# Patient Record
Sex: Female | Born: 1960
Health system: Southern US, Community
[De-identification: ages and names within clinical notes are randomized; demographics above are authoritative.]

## PROBLEM LIST (undated history)

## (undated) DIAGNOSIS — R202 Paresthesia of skin: Secondary | ICD-10-CM

## (undated) DIAGNOSIS — E785 Hyperlipidemia, unspecified: Secondary | ICD-10-CM

## (undated) DIAGNOSIS — R112 Nausea with vomiting, unspecified: Secondary | ICD-10-CM

## (undated) DIAGNOSIS — Z5189 Encounter for other specified aftercare: Secondary | ICD-10-CM

## (undated) DIAGNOSIS — E119 Type 2 diabetes mellitus without complications: Secondary | ICD-10-CM

## (undated) DIAGNOSIS — Z87442 Personal history of urinary calculi: Secondary | ICD-10-CM

## (undated) DIAGNOSIS — N289 Disorder of kidney and ureter, unspecified: Secondary | ICD-10-CM

## (undated) DIAGNOSIS — T8859XA Other complications of anesthesia, initial encounter: Secondary | ICD-10-CM

## (undated) DIAGNOSIS — Z8679 Personal history of other diseases of the circulatory system: Secondary | ICD-10-CM

## (undated) DIAGNOSIS — B379 Candidiasis, unspecified: Secondary | ICD-10-CM

## (undated) DIAGNOSIS — N281 Cyst of kidney, acquired: Secondary | ICD-10-CM

## (undated) DIAGNOSIS — R51 Headache: Secondary | ICD-10-CM

## (undated) DIAGNOSIS — N201 Calculus of ureter: Secondary | ICD-10-CM

## (undated) DIAGNOSIS — Q2112 Patent foramen ovale: Secondary | ICD-10-CM

## (undated) DIAGNOSIS — M810 Age-related osteoporosis without current pathological fracture: Secondary | ICD-10-CM

## (undated) DIAGNOSIS — I10 Essential (primary) hypertension: Secondary | ICD-10-CM

## (undated) DIAGNOSIS — Q211 Atrial septal defect: Secondary | ICD-10-CM

## (undated) DIAGNOSIS — Z8051 Family history of malignant neoplasm of kidney: Secondary | ICD-10-CM

## (undated) DIAGNOSIS — Z9889 Other specified postprocedural states: Secondary | ICD-10-CM

## (undated) DIAGNOSIS — I6990 Unspecified sequelae of unspecified cerebrovascular disease: Secondary | ICD-10-CM

## (undated) DIAGNOSIS — Z800 Family history of malignant neoplasm of digestive organs: Secondary | ICD-10-CM

## (undated) DIAGNOSIS — T7840XA Allergy, unspecified, initial encounter: Secondary | ICD-10-CM

## (undated) DIAGNOSIS — I639 Cerebral infarction, unspecified: Secondary | ICD-10-CM

## (undated) HISTORY — PX: EXTRACORPOREAL SHOCK WAVE LITHOTRIPSY: SHX1557

## (undated) HISTORY — DX: Allergy, unspecified, initial encounter: T78.40XA

## (undated) HISTORY — PX: WISDOM TOOTH EXTRACTION: SHX21

## (undated) HISTORY — DX: Family history of malignant neoplasm of digestive organs: Z80.0

## (undated) HISTORY — DX: Encounter for other specified aftercare: Z51.89

## (undated) HISTORY — DX: Family history of malignant neoplasm of kidney: Z80.51

## (undated) HISTORY — DX: Cerebral infarction, unspecified: I63.9

---

## 1996-02-22 HISTORY — PX: NEPHROLITHOTOMY: SUR881

## 1998-01-07 ENCOUNTER — Other Ambulatory Visit: Admission: RE | Admit: 1998-01-07 | Discharge: 1998-01-07 | Payer: Self-pay | Admitting: Plastic Surgery

## 1999-10-28 ENCOUNTER — Other Ambulatory Visit: Admission: RE | Admit: 1999-10-28 | Discharge: 1999-10-28 | Payer: Self-pay | Admitting: Obstetrics and Gynecology

## 2001-02-21 HISTORY — PX: APPENDECTOMY: SHX54

## 2001-08-10 ENCOUNTER — Inpatient Hospital Stay (HOSPITAL_COMMUNITY): Admission: EM | Admit: 2001-08-10 | Discharge: 2001-08-12 | Payer: Self-pay | Admitting: Emergency Medicine

## 2001-08-10 ENCOUNTER — Encounter: Payer: Self-pay | Admitting: General Surgery

## 2001-08-10 ENCOUNTER — Encounter (INDEPENDENT_AMBULATORY_CARE_PROVIDER_SITE_OTHER): Payer: Self-pay | Admitting: *Deleted

## 2001-08-10 ENCOUNTER — Encounter: Payer: Self-pay | Admitting: Emergency Medicine

## 2001-08-10 HISTORY — PX: LAPAROSCOPIC APPENDECTOMY: SUR753

## 2002-02-21 HISTORY — PX: CARDIOVASCULAR STRESS TEST: SHX262

## 2002-07-03 ENCOUNTER — Encounter: Payer: Self-pay | Admitting: Internal Medicine

## 2002-07-03 HISTORY — PX: CARDIOVASCULAR STRESS TEST: SHX262

## 2003-08-08 ENCOUNTER — Encounter: Payer: Self-pay | Admitting: Internal Medicine

## 2004-04-14 ENCOUNTER — Ambulatory Visit: Payer: Self-pay | Admitting: Family Medicine

## 2004-10-22 ENCOUNTER — Ambulatory Visit: Payer: Self-pay | Admitting: Internal Medicine

## 2007-04-06 ENCOUNTER — Ambulatory Visit: Payer: Self-pay | Admitting: Internal Medicine

## 2007-04-06 DIAGNOSIS — E782 Mixed hyperlipidemia: Secondary | ICD-10-CM

## 2007-04-06 DIAGNOSIS — E7800 Pure hypercholesterolemia, unspecified: Secondary | ICD-10-CM | POA: Insufficient documentation

## 2007-04-06 LAB — CONVERTED CEMR LAB
Cholesterol, target level: 200 mg/dL
LDL Goal: 130 mg/dL

## 2007-07-25 ENCOUNTER — Telehealth (INDEPENDENT_AMBULATORY_CARE_PROVIDER_SITE_OTHER): Payer: Self-pay | Admitting: *Deleted

## 2007-08-16 ENCOUNTER — Ambulatory Visit: Payer: Self-pay | Admitting: Internal Medicine

## 2007-08-16 DIAGNOSIS — I10 Essential (primary) hypertension: Secondary | ICD-10-CM

## 2007-08-16 DIAGNOSIS — Z87442 Personal history of urinary calculi: Secondary | ICD-10-CM

## 2007-08-16 LAB — CONVERTED CEMR LAB
HDL goal, serum: 50 mg/dL
LDL Goal: 120 mg/dL

## 2007-08-25 LAB — CONVERTED CEMR LAB
AST: 16 units/L (ref 0–37)
Alkaline Phosphatase: 50 units/L (ref 39–117)
Basophils Absolute: 0 10*3/uL (ref 0.0–0.1)
Bilirubin, Direct: 0.1 mg/dL (ref 0.0–0.3)
CO2: 30 meq/L (ref 19–32)
Chloride: 101 meq/L (ref 96–112)
Direct LDL: 125 mg/dL
Eosinophils Absolute: 0.2 10*3/uL (ref 0.0–0.7)
GFR calc non Af Amer: 83 mL/min
HDL: 58.4 mg/dL (ref >39.0)
Lymphocytes Relative: 26 % (ref 12.0–46.0)
MCHC: 34.7 g/dL (ref 30.0–36.0)
MCV: 90.1 fL (ref 78.0–100.0)
Neutrophils Relative %: 63.2 % (ref 43.0–77.0)
Platelets: 274 10*3/uL (ref 150–400)
Potassium: 3.8 meq/L (ref 3.5–5.1)
Total Bilirubin: 1.1 mg/dL (ref 0.3–1.2)
Triglycerides: 108 mg/dL (ref 0–149)
VLDL: 22 mg/dL (ref 0–40)
WBC: 8.1 10*3/uL (ref 4.5–10.5)

## 2007-08-29 ENCOUNTER — Encounter (INDEPENDENT_AMBULATORY_CARE_PROVIDER_SITE_OTHER): Payer: Self-pay | Admitting: *Deleted

## 2008-02-22 DIAGNOSIS — Z8673 Personal history of transient ischemic attack (TIA), and cerebral infarction without residual deficits: Secondary | ICD-10-CM

## 2008-02-22 DIAGNOSIS — Q2112 Patent foramen ovale: Secondary | ICD-10-CM

## 2008-02-22 DIAGNOSIS — I639 Cerebral infarction, unspecified: Secondary | ICD-10-CM

## 2008-02-22 HISTORY — DX: Cerebral infarction, unspecified: I63.9

## 2008-02-22 HISTORY — DX: Patent foramen ovale: Q21.12

## 2008-02-22 HISTORY — DX: Personal history of transient ischemic attack (TIA), and cerebral infarction without residual deficits: Z86.73

## 2008-05-19 ENCOUNTER — Telehealth (INDEPENDENT_AMBULATORY_CARE_PROVIDER_SITE_OTHER): Payer: Self-pay | Admitting: *Deleted

## 2008-06-05 ENCOUNTER — Telehealth (INDEPENDENT_AMBULATORY_CARE_PROVIDER_SITE_OTHER): Payer: Self-pay | Admitting: *Deleted

## 2008-08-15 ENCOUNTER — Emergency Department (HOSPITAL_COMMUNITY): Admission: EM | Admit: 2008-08-15 | Discharge: 2008-08-16 | Payer: Self-pay | Admitting: Emergency Medicine

## 2008-08-20 ENCOUNTER — Ambulatory Visit: Payer: Self-pay | Admitting: Internal Medicine

## 2008-08-20 DIAGNOSIS — E876 Hypokalemia: Secondary | ICD-10-CM

## 2008-08-20 DIAGNOSIS — R209 Unspecified disturbances of skin sensation: Secondary | ICD-10-CM

## 2008-08-20 DIAGNOSIS — R7303 Prediabetes: Secondary | ICD-10-CM | POA: Insufficient documentation

## 2008-08-21 ENCOUNTER — Telehealth (INDEPENDENT_AMBULATORY_CARE_PROVIDER_SITE_OTHER): Payer: Self-pay | Admitting: *Deleted

## 2008-08-23 ENCOUNTER — Encounter: Admission: RE | Admit: 2008-08-23 | Discharge: 2008-08-23 | Payer: Self-pay | Admitting: Internal Medicine

## 2008-08-24 ENCOUNTER — Telehealth: Payer: Self-pay | Admitting: Endocrinology

## 2008-08-25 LAB — CONVERTED CEMR LAB
BUN: 14 mg/dL (ref 6–23)
Creatinine, Ser: 0.8 mg/dL (ref 0.4–1.2)
Potassium: 4.4 meq/L (ref 3.5–5.1)

## 2008-08-26 ENCOUNTER — Encounter (INDEPENDENT_AMBULATORY_CARE_PROVIDER_SITE_OTHER): Payer: Self-pay | Admitting: *Deleted

## 2008-08-29 ENCOUNTER — Ambulatory Visit: Payer: Self-pay | Admitting: Internal Medicine

## 2008-08-29 LAB — CONVERTED CEMR LAB: LDL Goal: 100 mg/dL

## 2008-10-15 ENCOUNTER — Encounter: Payer: Self-pay | Admitting: Internal Medicine

## 2008-10-21 ENCOUNTER — Encounter: Payer: Self-pay | Admitting: Cardiovascular Disease

## 2008-10-21 ENCOUNTER — Encounter (INDEPENDENT_AMBULATORY_CARE_PROVIDER_SITE_OTHER): Payer: Self-pay | Admitting: Neurology

## 2008-10-21 ENCOUNTER — Ambulatory Visit: Payer: Self-pay

## 2008-11-20 ENCOUNTER — Encounter: Payer: Self-pay | Admitting: Cardiology

## 2008-11-20 HISTORY — PX: OTHER SURGICAL HISTORY: SHX169

## 2008-12-02 ENCOUNTER — Ambulatory Visit: Payer: Self-pay | Admitting: Internal Medicine

## 2008-12-02 DIAGNOSIS — Z8673 Personal history of transient ischemic attack (TIA), and cerebral infarction without residual deficits: Secondary | ICD-10-CM

## 2008-12-02 DIAGNOSIS — R51 Headache: Secondary | ICD-10-CM

## 2008-12-02 DIAGNOSIS — R519 Headache, unspecified: Secondary | ICD-10-CM | POA: Insufficient documentation

## 2008-12-04 ENCOUNTER — Encounter (INDEPENDENT_AMBULATORY_CARE_PROVIDER_SITE_OTHER): Payer: Self-pay | Admitting: *Deleted

## 2008-12-04 LAB — CONVERTED CEMR LAB
AST: 17 units/L (ref 0–37)
Albumin: 4.5 g/dL (ref 3.5–5.2)
Alkaline Phosphatase: 45 units/L (ref 39–117)
Basophils Relative: 0.5 % (ref 0.0–3.0)
CO2: 28 meq/L (ref 19–32)
Calcium: 9.2 mg/dL (ref 8.4–10.5)
GFR calc non Af Amer: 82.04 mL/min (ref >60)
HDL: 59.7 mg/dL (ref >39.00)
Hemoglobin: 14.3 g/dL (ref 12.0–15.0)
Lymphocytes Relative: 35 % (ref 12.0–46.0)
MCHC: 34.7 g/dL (ref 30.0–36.0)
Monocytes Relative: 6.9 % (ref 3.0–12.0)
Neutro Abs: 2.9 10*3/uL (ref 1.4–7.7)
Potassium: 4.8 meq/L (ref 3.5–5.1)
RBC: 4.56 M/uL (ref 3.87–5.11)
Sodium: 138 meq/L (ref 135–145)
Total CHOL/HDL Ratio: 3
Total Protein: 6.9 g/dL (ref 6.0–8.3)

## 2009-01-05 ENCOUNTER — Telehealth: Payer: Self-pay | Admitting: Cardiology

## 2009-01-05 ENCOUNTER — Encounter: Payer: Self-pay | Admitting: Cardiology

## 2009-01-06 ENCOUNTER — Encounter: Payer: Self-pay | Admitting: Cardiology

## 2009-01-19 ENCOUNTER — Ambulatory Visit (HOSPITAL_COMMUNITY): Admission: RE | Admit: 2009-01-19 | Discharge: 2009-01-19 | Payer: Self-pay | Admitting: Cardiovascular Disease

## 2009-01-19 ENCOUNTER — Ambulatory Visit: Payer: Self-pay | Admitting: Cardiovascular Disease

## 2009-01-19 ENCOUNTER — Encounter: Payer: Self-pay | Admitting: Cardiovascular Disease

## 2009-01-19 HISTORY — PX: TRANSESOPHAGEAL ECHOCARDIOGRAM: SHX273

## 2009-02-25 ENCOUNTER — Encounter: Payer: Self-pay | Admitting: Internal Medicine

## 2009-03-19 ENCOUNTER — Telehealth (INDEPENDENT_AMBULATORY_CARE_PROVIDER_SITE_OTHER): Payer: Self-pay | Admitting: *Deleted

## 2009-06-23 ENCOUNTER — Encounter: Payer: Self-pay | Admitting: Internal Medicine

## 2009-09-01 ENCOUNTER — Encounter: Payer: Self-pay | Admitting: Internal Medicine

## 2009-12-31 ENCOUNTER — Telehealth (INDEPENDENT_AMBULATORY_CARE_PROVIDER_SITE_OTHER): Payer: Self-pay | Admitting: *Deleted

## 2010-03-18 ENCOUNTER — Ambulatory Visit
Admission: RE | Admit: 2010-03-18 | Discharge: 2010-03-18 | Payer: Self-pay | Source: Home / Self Care | Attending: Internal Medicine | Admitting: Internal Medicine

## 2010-03-18 ENCOUNTER — Other Ambulatory Visit: Payer: Self-pay | Admitting: Internal Medicine

## 2010-03-18 ENCOUNTER — Encounter: Payer: Self-pay | Admitting: Internal Medicine

## 2010-03-18 LAB — HEPATIC FUNCTION PANEL
Albumin: 4.1 g/dL (ref 3.5–5.2)
Alkaline Phosphatase: 45 U/L (ref 39–117)
Total Protein: 6.6 g/dL (ref 6.0–8.3)

## 2010-03-18 LAB — CBC WITH DIFFERENTIAL/PLATELET
Basophils Absolute: 0 10*3/uL (ref 0.0–0.1)
Basophils Relative: 0.2 % (ref 0.0–3.0)
Eosinophils Absolute: 0.1 10*3/uL (ref 0.0–0.7)
HCT: 40.8 % (ref 36.0–46.0)
Hemoglobin: 13.7 g/dL (ref 12.0–15.0)
Lymphocytes Relative: 24.5 % (ref 12.0–46.0)
Lymphs Abs: 2 10*3/uL (ref 0.7–4.0)
MCHC: 33.6 g/dL (ref 30.0–36.0)
MCV: 92.4 fl (ref 78.0–100.0)
Neutro Abs: 5.5 10*3/uL (ref 1.4–7.7)
RBC: 4.41 Mil/uL (ref 3.87–5.11)
RDW: 12.3 % (ref 11.5–14.6)

## 2010-03-18 LAB — TSH: TSH: 1.01 u[IU]/mL (ref 0.35–5.50)

## 2010-03-18 LAB — BASIC METABOLIC PANEL
CO2: 28 mEq/L (ref 19–32)
Chloride: 100 mEq/L (ref 96–112)
Glucose, Bld: 71 mg/dL (ref 70–99)
Potassium: 4.7 mEq/L (ref 3.5–5.1)
Sodium: 137 mEq/L (ref 135–145)

## 2010-03-18 LAB — LIPID PANEL
Cholesterol: 167 mg/dL (ref 0–200)
HDL: 52.4 mg/dL (ref >39.00)
LDL Cholesterol: 105 mg/dL — ABNORMAL HIGH (ref 0–99)
Total CHOL/HDL Ratio: 3
Triglycerides: 49 mg/dL (ref 0.0–149.0)
VLDL: 9.8 mg/dL (ref 0.0–40.0)

## 2010-03-23 NOTE — Progress Notes (Signed)
Summary: Diltiazem refill  Phone Note Refill Request Message from:  Patient on December 31, 2009 11:36 AM  Refills Requested: Medication #1:  DILTIAZEM HCL 90 MG  TABS 1 two times a day [facility] , [address]     patient has scheduled CPX and fasting labs for 03/18/2010 at 9:40am  Initial call taken by: Jerolyn Shin,  December 31, 2009 11:37 AM    Prescriptions: DILTIAZEM HCL 90 MG  TABS (DILTIAZEM HCL) 1 two times a day  #180 Each x 0   Entered by:   Shonna Chock CMA   Authorized by:   Marga Melnick MD   Signed by:   Shonna Chock CMA on 12/31/2009   Method used:   Electronically to        [facility]  [phone]* (retail)       [address]       [place], [place]  [address]       Ph: [phone] or [phone]       Fax: [phone]   RxID:   [id number]

## 2010-03-23 NOTE — Progress Notes (Signed)
Summary: lab results  Phone Note Call from Patient Call back at Home Phone [phone]   Caller: Patient-left voice msg Summary of Call: patient left msg says she never received her labs from 12/02/2008  Your minimal LDL goal = < 120,ideal is < 95. HDL > 40 is protective as is smoking cessation. Great reports! share with all MDs seen. Hopp  Follow-up for Phone Call        left message on machine ................Marland KitchenDoristine Devoid  March 19, 2009 4:00 PM   Additional Follow-up for Phone Call Additional follow up Details #1::        Spoke with patient, patient aware of lab results and I verified address and remailed Additional Follow-up by: Shonna Chock,  March 20, 2009 3:43 PM

## 2010-03-23 NOTE — Letter (Signed)
Summary: [facility]  [facility]   Imported By: Lanelle Bal 03/05/2009 09:51:32  _____________________________________________________________________  External Attachment:    Type:   Image     Comment:   External Document

## 2010-03-25 NOTE — Assessment & Plan Note (Signed)
Summary: cpx and fasting labs///sph   Vital Signs:  Patient profile:   50 year old female Height:      59 inches Weight:      138.8 pounds BMI:     28.14 Temp:     98.3 degrees F oral Pulse rate:   76 / minute Resp:     14 per minute BP sitting:   110 / 76  (left arm) Cuff size:   regular  Vitals Entered By: Shonna Chock CMA (March 18, 2010 9:56 AM) CC: CPX with fasting labs    CC:  CPX with fasting labs .  History of Present Illness:    Brenda Gomez is here for a  physical; she has rare numbness & tingling radiating from hands to lateral thorax down to waist bilaterally lasting < 3 minutes. Dr. Anne Hahn said he could not explain this phenomenom.    Hyperlipidemia Follow-Up:The patient denies muscle aches, GI upset, abdominal pain, flushing, itching, constipation, diarrhea, and fatigue.  The patient denies the following symptoms: chest pain/pressure, exercise intolerance, dypsnea, palpitations, syncope, and pedal edema.  Compliance with medications (by patient report) has been near 100%.  Dietary compliance has been good.  Adjunctive measures currently used by the patient include fiber, ASA, and fish oil supplements.     Hypertension Follow-Up:The patient reports headaches responsive to Nortrityline Rxed by Dr Anne Hahn, but denies lightheadedness and urinary frequency.  Compliance with medications (by patient report) has been near 100%.  Adjunctive measures currently used by the patient include salt restriction.    Preventive Screening-Counseling & Management  Alcohol-Tobacco     Smoking Cessation Counseling: yes  Caffeine-Diet-Exercise     Does Patient Exercise: no  Current Medications (verified): 1)  Zyrtec 10mg  Prn 2)  Diltiazem Hcl 90 Mg  Tabs (Diltiazem Hcl) .Marland Kitchen.. 1 Two Times A Day 3)  Lisinopril 20 Mg  Tabs (Lisinopril) .Marland Kitchen.. 1 Two Times A Day 4)  Pravastatin Sodium 20 Mg Tabs (Pravastatin Sodium) .Marland Kitchen.. 1 At Bedtime **appointment Due** 5)  Denavir 1 % Crea (Penciclovir) ....  As Directed 6)  Nortriptyline Hcl 10 Mg Caps (Nortriptyline Hcl) .... 2-3 At Bedtime 7)  Aspirin 81 Mg Tabs (Aspirin) .Marland Kitchen.. 1 By Mouth Once Daily  Allergies: 1)  ! Codeine  Past History:  Past Medical History: Hypertension Hyperlipidemia: LDL 137(1525 total particles / 715 small dense), HDL 47, TG 139 for 15% risk; minimal LDL goal = < 120, ideal < 95  based on NMR Lipoprofile Nephrolithiasis, PMH  of, Dr Aldean Ast Cerebrovascular accident, PMH  of, R pontine & L thalamic, Dr Anne Hahn  Past Surgical History: Appendectomy; G2 P2; Wisdom Teeth Extraction; Kidney Stones w/ left kidney damage secondary to infection; renal cyst, Dr Vic Blackbird  Family History: Father: HTN Mother: DM,CVA   @ 35 Siblings: bro: HTN,sister:bone cancer  ,S/P BKA unilaterally ZOX:WRUEAVWU MGM:DM,HTN,TIA,MI  @ age 62 MGGM:DM  Social History: No diet Occupation: Systems developer Married Smoking 1 cigarette; exposed to 2nd hand smoke Alcohol use-yes: rarely Regular exercise-no Does Patient Exercise:  no  Review of Systems  The patient denies anorexia, fever, weight loss, weight gain, vision loss, decreased hearing, hoarseness, prolonged cough, hemoptysis, melena, hematochezia, severe indigestion/heartburn, hematuria, suspicious skin lesions, depression, unusual weight change, abnormal bleeding, enlarged lymph nodes, and angioedema.    Physical Exam  General:  well-nourished,in no acute distress; alert,appropriate and cooperative throughout examination Head:  Normocephalic and atraumatic without obvious abnormalities. Eyes:  No corneal or conjunctival inflammation noted. EOMI. Perrla. Funduscopic exam benign, without  hemorrhages, exudates or papilledema.  Ears:  External ear exam shows no significant lesions or deformities.  Otoscopic examination reveals clear canals, tympanic membranes are intact bilaterally without bulging, retraction, inflammation or discharge. Hearing is grossly normal  bilaterally. Nose:  External nasal examination shows no deformity or inflammation. Nasal mucosa are pink and moist without lesions or exudates. Slight  septal deviation Mouth:  Oral mucosa and oropharynx without lesions or exudates.  Upper plate Neck:  No deformities, masses, or tenderness noted. Lungs:  Normal respiratory effort, chest expands symmetrically. Lungs are clear to auscultation, no crackles or wheezes. Heart:  Normal rate and regular rhythm. S1 and S2 normal without gallop, murmur, click, rub . Abdomen:  Bowel sounds positive,abdomen soft and non-tender without masses, organomegaly or hernias noted. Genitalia:  Dr Arelia Sneddon Msk:  No deformity or scoliosis noted of thoracic or lumbar spine.   Pulses:  R and L carotid,radial,dorsalis pedis and posterior tibial pulses are full and equal bilaterally Extremities:  No clubbing, cyanosis, edema, or deformity noted with normal full range of motion of all joints.   Neurologic:  alert & oriented X3, strength normal in all extremities, and DTRs symmetrical and normal.  Neg Tinel's bilaterally Skin:  Intact without suspicious lesions or rashes Cervical Nodes:  No lymphadenopathy noted Axillary Nodes:  No palpable lymphadenopathy Psych:  memory intact for recent and remote, normally interactive, and good eye contact.     Impression & Recommendations:  Problem # 1:  ROUTINE GENERAL MEDICAL EXAM@HEALTH  CARE FACL (ICD-V70.0)  Orders: EKG w/ Interpretation (93000) Venipuncture (21308) TLB-Lipid Panel (80061-LIPID) TLB-BMP (Basic Metabolic Panel-BMET) (80048-METABOL) TLB-CBC Platelet - w/Differential (85025-CBCD) TLB-Hepatic/Liver Function Pnl (80076-HEPATIC) TLB-TSH (Thyroid Stimulating Hormone) (84443-TSH) TLB-Magnesium (Mg) (83735-MG)  Problem # 2:  PARESTHESIA (ICD-782.0) Atypical  Problem # 3:  CEREBROVASCULAR ACCIDENT, HX OF (ICD-V12.50)  Problem # 4:  HYPERLIPIDEMIA (ICD-272.2)  Her updated medication list for this problem  includes:    Pravastatin Sodium 20 Mg Tabs (Pravastatin sodium) .Marland Kitchen... 1 at bedtime  Problem # 5:  HYPERTENSION (ICD-401.9) controlled Her updated medication list for this problem includes:    Diltiazem Hcl 90 Mg Tabs (Diltiazem hcl) .Marland Kitchen... 1 two times a day    Lisinopril 20 Mg Tabs (Lisinopril) .Marland Kitchen... 1 two times a day  Complete Medication List: 1)  Zyrtec 10mg  Prn  2)  Diltiazem Hcl 90 Mg Tabs (Diltiazem hcl) .Marland Kitchen.. 1 two times a day 3)  Lisinopril 20 Mg Tabs (Lisinopril) .Marland Kitchen.. 1 two times a day 4)  Pravastatin Sodium 20 Mg Tabs (Pravastatin sodium) .Marland Kitchen.. 1 at bedtime 5)  Denavir 1 % Crea (Penciclovir) .... As directed 6)  Nortriptyline Hcl 10 Mg Caps (Nortriptyline hcl) .... 2-3 at bedtime 7)  Aspirin 81 Mg Tabs (Aspirin) .Marland Kitchen.. 1 by mouth once daily  Other Orders: Tdap => 45yrs IM (65784) Admin 1st Vaccine (69629)  Patient Instructions: 1)  Smoking doubles or triples risk of MI or CVA as discussed.Marland Kitchen 2)  Check your Blood Pressure regularly. If it is above:  [address] you should make an appointment. Prescriptions: PRAVASTATIN SODIUM 20 MG TABS (PRAVASTATIN SODIUM) 1 at bedtime  #90 x 3   Entered and Authorized by:   Marga Melnick MD   Signed by:   Marga Melnick MD on 03/18/2010   Method used:   Print then Give to Patient   RxID:   [id number] LISINOPRIL 20 MG  TABS (LISINOPRIL) 1 two times a day  #180 x 3   Entered and Authorized by:   Chrissie Noa  Hopper MD   Signed by:   Marga Melnick MD on 03/18/2010   Method used:   Print then Give to Patient   RxID:   [id number] DILTIAZEM HCL 90 MG  TABS (DILTIAZEM HCL) 1 two times a day  #180 Each x 3   Entered and Authorized by:   Marga Melnick MD   Signed by:   Marga Melnick MD on 03/18/2010   Method used:   Print then Give to Patient   RxID:   [id number]    Orders Added: 1)  Tdap => 59yrs IM [90715] 2)  Admin 1st Vaccine [90471] 3)  Est. Patient 40-64 years [99396] 4)  EKG w/ Interpretation [93000] 5)   Venipuncture [36415] 6)  TLB-Lipid Panel [80061-LIPID] 7)  TLB-BMP (Basic Metabolic Panel-BMET) [80048-METABOL] 8)  TLB-CBC Platelet - w/Differential [85025-CBCD] 9)  TLB-Hepatic/Liver Function Pnl [80076-HEPATIC] 10)  TLB-TSH (Thyroid Stimulating Hormone) [84443-TSH] 11)  TLB-Magnesium (Mg) [83735-MG]   Immunizations Administered:  Tetanus Vaccine:    Vaccine Type: Tdap    Site: right deltoid    Mfr: GlaxoSmithKline    Dose: 0.5 ml    Route: IM    Given by: Shonna Chock CMA    Exp. Date: 12/11/2011    Lot #: [id number]    VIS given: 01/09/08 version given March 18, 2010.   Immunizations Administered:  Tetanus Vaccine:    Vaccine Type: Tdap    Site: right deltoid    Mfr: GlaxoSmithKline    Dose: 0.5 ml    Route: IM    Given by: Shonna Chock CMA    Exp. Date: 12/11/2011    Lot #: [id number]    VIS given: 01/09/08 version given March 18, 2010.   Appended Document: cpx and fasting labs///sph

## 2010-03-26 NOTE — Letter (Signed)
Summary: [facility]  [facility]   Imported By: Lanelle Bal 09/09/2009 10:52:01  _____________________________________________________________________  External Attachment:    Type:   Image     Comment:   External Document

## 2010-05-31 LAB — URINALYSIS, ROUTINE W REFLEX MICROSCOPIC
Bilirubin Urine: NEGATIVE
Glucose, UA: NEGATIVE mg/dL
Ketones, ur: NEGATIVE mg/dL
Nitrite: NEGATIVE
pH: 7.5 (ref 5.0–8.0)

## 2010-05-31 LAB — BASIC METABOLIC PANEL
Calcium: 8.7 mg/dL (ref 8.4–10.5)
GFR calc Af Amer: >60 mL/min (ref >60)
GFR calc non Af Amer: >60 mL/min (ref >60)
Sodium: 134 mEq/L — ABNORMAL LOW (ref 135–145)

## 2010-05-31 LAB — DIFFERENTIAL
Lymphocytes Relative: 14 % (ref 12–46)
Monocytes Absolute: 0.6 10*3/uL (ref 0.1–1.0)
Monocytes Relative: 5 % (ref 3–12)
Neutro Abs: 9.8 10*3/uL — ABNORMAL HIGH (ref 1.7–7.7)

## 2010-05-31 LAB — CBC
Hemoglobin: 14.6 g/dL (ref 12.0–15.0)
RBC: 4.75 MIL/uL (ref 3.87–5.11)
WBC: 12.3 10*3/uL — ABNORMAL HIGH (ref 4.0–10.5)

## 2010-05-31 LAB — URINE CULTURE: Colony Count: 15000

## 2010-05-31 LAB — POCT PREGNANCY, URINE: Preg Test, Ur: NEGATIVE

## 2010-07-09 NOTE — Op Note (Signed)
[place]  Patient:    Brenda Gomez, Brenda Gomez Visit Number: [id number] MRN: [id number]          Service Type: SUR Location: [id number] Attending Physician:  Arlis Porta Dictated by:   Adolph Pollack, M.D. Proc. Date: 08/10/01 Admit Date:  08/10/2001 Discharge Date: 08/12/2001                             Operative Report  PREOPERATIVE DIAGNOSIS:  Acute appendicitis.  POSTOPERATIVE DIAGNOSIS:  Acute appendicitis.  PROCEDURE:  Laparoscopic appendectomy.  SURGEON:  Adolph Pollack, M.D.  ANESTHESIA:  General.  INDICATION:  This is a 50 year old female who presented to the emergency department with abdominal pain.  It was mostly in the right lower quadrant. Evaluation which included a CT scan was consistent with appendicitis, and I was asked to see her.  She is now brought to the operating room.  DESCRIPTION OF PROCEDURE:  She was placed supine on the operating table and a general anesthetic was administered.  A Foley catheter was placed in the bladder.  Her abdomen was sterilely prepped and draped.  Local anesthetic was infiltrated in the umbilicus, and a small subumbilical incision was made, incising the skin and subcutaneous tissue sharply.  The midline fascia was identified and incised.  A pursestring suture of 0 Vicryl was placed around the fascial edges.  The peritoneum was grasped and entered bluntly.  A Hasson trocar was introduced to the peritoneal cavity and pneumoperitoneum created by insufflation of CO2 gas.  Next a laparoscope was introduced.  Under direct vision in the lower midline a 5 mm trocar was placed.  She was then placed in Trendelenburg position and partial right lateral decubitus.  I identified the appendix, and it was acutely inflamed.  It was adherent to part of the ileum but with gentle blunt dissection, this was freed up.  I then placed a 5 mm trocar in the right upper quadrant, grasped the  mesoappendix, and retracted the appendix anteriorly toward the abdominal wall.  Using the Harmonic scalpel, the mesoappendix was divided down to the base of the appendix.  The appendix was then amputated with the Endo-GIA stapler.  The staple line was solid without bleeding.  The appendix was placed in an Endopouch bag, and this was removed through the subumbilical port.  I then copiously irrigated out the right lower quadrant area with 1 L of normal saline and evacuated the majority of this.  I reinspected the staple line, and again it was solid without bleeding.  I subsequently removed the Hasson trocar and closed the fascial defect under direct laparoscopic vision by tightening up and tying down the pursestring suture.  The remaining trocars were removed and the pneumoperitoneum was released.  The skin incisions were closed with 4-0 Monocryl subcuticular stitches, followed by Steri-Strips and sterile dressings.  She tolerated the procedure well without any apparent complications and was taken to the recovery room in satisfactory condition. Dictated by:   Adolph Pollack, M.D. Attending Physician:  Arlis Porta DD:  08/10/01 TD:  08/13/01 Job: [id number]

## 2010-07-09 NOTE — H&P (Signed)
[place]. [facility]  Patient:    Brenda Gomez, Brenda Gomez Visit Number: [id number] MRN: [id number]          Service Type: SUR Location: 5000 [id number] Attending Physician:  Arlis Porta Dictated by:   Adolph Pollack, M.D. Admit Date:  08/10/2001 Discharge Date: 08/12/2001                           History and Physical  REASON FOR ADMISSION:  Right lower quadrant pain, appendicitis on CT scan.  HISTORY OF PRESENT ILLNESS:  Ms. Schroepfer is a 50 year old female in her normal state of health until she awoke at 4 oclock in the morning with cramping and throbbing type lower abdominal pain.  The pain eventually radiated to right lower quadrant with increasing severity and was like the throbbing type pain. It was associated with a subjective fever and anorexia.  She eventually presented to the emergency department because it became so bad.  Evaluation in the emergency department included a CT scan which demonstrated a fecalith and findings consistent with acute appendicitis.  I was asked to see her at that time.  She denies any change in bowel habits.  She does not have dysuria.  PAST MEDICAL HISTORY: 1. Hypertension. 2. Nephrolithiasis. 3. Allergic rhinitis.  PREVIOUS OPERATIONS:  Left nephrolithectomy.  ALLERGIES:  CODEINE.  MEDICATIONS: 1. Norvasc. 2. Zyrtec.  SOCIAL HISTORY:  She smokes approximately a pack of cigarettes every three days.  She is married, has two children, and is employed.  FAMILY HISTORY:  Positive for diabetes and hypertension in mother.  Sister died from cancer but she does not know the type.  REVIEW OF SYSTEMS:  CARDIOVASCULAR:  No known heart disease in herself. PULMONARY:  She denies asthma, pneumonia, TB.  GASTROINTESTINAL:  Denies peptic ulcer disease, hepatitis, diverticulitis.  ENDOCRINE:  No diabetes or thyroid disease.  HEMATOLOGIC:  She states she had a transfusion in the past. No known bleeding disorders or  deep venous thrombosis.  NEUROLOGIC:  No seizures.  PHYSICAL EXAMINATION:  GENERAL:  An ill-appearing female.  VITAL SIGNS:  At 10:30 this morning:  Temperature 97.4, pulse 98, blood pressure 130/80.  SKIN:  Appears to have increased warmth.  HEENT:  Normocephalic, atraumatic, EOMI.  NECK:  Supple without palpable masses.  CARDIOVASCULAR:  Heart demonstrates an increased rate with irregularity.  RESPIRATORY:  Breath sounds equal and clear.  Respirations nonlabored.  ABDOMEN:  Soft with right lower quadrant tenderness and guarding with palpation and percussion.  She has a positive Rovsings sign.  Active bowel sounds noted.  BACK:  No CVA tenderness.  EXTREMITIES:  No cyanosis or edema.  Full range of motion.  LABORATORY DATA:  White blood cell count 19,000 with a hemoglobin of 14. Platelet count 256,000.  Amylase and lipase normal.  Liver function tests normal.  Potassium slightly low at 3.4.  UA shows 3-6 white blood cells. Urine protein negative.  IMPRESSION:  Acute appendicitis.  PLAN:  IV antibiotics and laparoscopic appendectomy.  I did explain the procedure and risks to her.  The risks include, but are not limited to, bleeding, infection, accidental damage to bladder, intestine, or other intra-abdominal organs, and risk of anesthesia.  She seemed to understand this and agrees to proceed. Dictated by:   Adolph Pollack, M.D. Attending Physician:  Arlis Porta DD:  08/10/01 TD:  08/12/01 Job: [id number]

## 2010-07-22 ENCOUNTER — Encounter: Payer: Self-pay | Admitting: Internal Medicine

## 2011-03-01 ENCOUNTER — Telehealth: Payer: Self-pay

## 2011-03-01 NOTE — Telephone Encounter (Signed)
FYI:  Pt left a message on triage vm stating that she has a reoccurring pain in the front lower right side for a couple of months.  Pt states she thought it was a female problem but the pain is growing in intensity.    Called pt and she states the pain is growing in intensity and pt had her appendix out a few years ago.  Pt has been taking ibuprofen for pain. Pt denies vomiting, fever, nausea, or diarrhea.  Pt states she is having to bend to the left for relief.    Pt advised to call gyn doctor to see if she can get appt sooner.  Pt will call back.

## 2011-03-01 NOTE — Telephone Encounter (Signed)
Pt called back and states she has an appt. With her gyn on tomorrow.  After appt pt will call office back.

## 2011-03-03 ENCOUNTER — Ambulatory Visit: Payer: Self-pay | Admitting: Internal Medicine

## 2011-03-18 ENCOUNTER — Other Ambulatory Visit: Payer: Self-pay | Admitting: Internal Medicine

## 2011-03-23 ENCOUNTER — Encounter (HOSPITAL_BASED_OUTPATIENT_CLINIC_OR_DEPARTMENT_OTHER): Payer: Self-pay | Admitting: *Deleted

## 2011-03-23 ENCOUNTER — Other Ambulatory Visit: Payer: Self-pay | Admitting: Urology

## 2011-03-23 NOTE — Progress Notes (Addendum)
NPO AFTER MN. ARRIVES AT 1130. NEEDS ISTAT, EKG, AND URINE PREG.  WILL TAKE DILTIAZEM AM OF SURG. W/ SIP OF WATER AND IF NEEDED TAKE HYDROCODONE.  REVIEWED CHART W/ DR ROSE MDA, STATES OK TO PROCEED AND NOTATE IN CHART NO AIR BUBBLES IN IV LINE.

## 2011-03-24 ENCOUNTER — Encounter (HOSPITAL_BASED_OUTPATIENT_CLINIC_OR_DEPARTMENT_OTHER): Payer: Self-pay | Admitting: *Deleted

## 2011-03-24 ENCOUNTER — Other Ambulatory Visit: Payer: Self-pay | Admitting: Urology

## 2011-03-24 ENCOUNTER — Encounter (HOSPITAL_BASED_OUTPATIENT_CLINIC_OR_DEPARTMENT_OTHER): Payer: Self-pay | Admitting: Anesthesiology

## 2011-03-24 ENCOUNTER — Encounter (HOSPITAL_BASED_OUTPATIENT_CLINIC_OR_DEPARTMENT_OTHER)
Admission: RE | Disposition: A | Discharge status: Home / Self Care | Payer: Self-pay | Source: Ambulatory Visit | Attending: Urology

## 2011-03-24 ENCOUNTER — Other Ambulatory Visit: Payer: Self-pay

## 2011-03-24 ENCOUNTER — Ambulatory Visit (HOSPITAL_BASED_OUTPATIENT_CLINIC_OR_DEPARTMENT_OTHER): Payer: 59 | Admitting: Anesthesiology

## 2011-03-24 ENCOUNTER — Ambulatory Visit (HOSPITAL_BASED_OUTPATIENT_CLINIC_OR_DEPARTMENT_OTHER)
Admission: RE | Admit: 2011-03-24 | Discharge: 2011-03-24 | Disposition: A | Discharge status: Home / Self Care | Payer: 59 | Source: Ambulatory Visit | Attending: Urology | Admitting: Urology

## 2011-03-24 DIAGNOSIS — N269 Renal sclerosis, unspecified: Secondary | ICD-10-CM | POA: Insufficient documentation

## 2011-03-24 DIAGNOSIS — D414 Neoplasm of uncertain behavior of bladder: Secondary | ICD-10-CM | POA: Insufficient documentation

## 2011-03-24 DIAGNOSIS — N189 Chronic kidney disease, unspecified: Secondary | ICD-10-CM | POA: Insufficient documentation

## 2011-03-24 DIAGNOSIS — Z79899 Other long term (current) drug therapy: Secondary | ICD-10-CM | POA: Insufficient documentation

## 2011-03-24 DIAGNOSIS — N201 Calculus of ureter: Secondary | ICD-10-CM | POA: Insufficient documentation

## 2011-03-24 DIAGNOSIS — I129 Hypertensive chronic kidney disease with stage 1 through stage 4 chronic kidney disease, or unspecified chronic kidney disease: Secondary | ICD-10-CM | POA: Insufficient documentation

## 2011-03-24 DIAGNOSIS — R3129 Other microscopic hematuria: Secondary | ICD-10-CM | POA: Insufficient documentation

## 2011-03-24 DIAGNOSIS — N200 Calculus of kidney: Secondary | ICD-10-CM | POA: Insufficient documentation

## 2011-03-24 DIAGNOSIS — Z7982 Long term (current) use of aspirin: Secondary | ICD-10-CM | POA: Insufficient documentation

## 2011-03-24 HISTORY — DX: Headache: R51

## 2011-03-24 HISTORY — DX: Unspecified sequelae of unspecified cerebrovascular disease: I69.90

## 2011-03-24 HISTORY — DX: Hyperlipidemia, unspecified: E78.5

## 2011-03-24 HISTORY — DX: Personal history of urinary calculi: Z87.442

## 2011-03-24 HISTORY — DX: Paresthesia of skin: R20.2

## 2011-03-24 HISTORY — PX: CYSTOSCOPY W/ RETROGRADES: SHX1426

## 2011-03-24 HISTORY — DX: Cyst of kidney, acquired: N28.1

## 2011-03-24 HISTORY — DX: Essential (primary) hypertension: I10

## 2011-03-24 HISTORY — DX: Calculus of ureter: N20.1

## 2011-03-24 HISTORY — DX: Atrial septal defect: Q21.1

## 2011-03-24 HISTORY — DX: Patent foramen ovale: Q21.12

## 2011-03-24 HISTORY — DX: Personal history of other diseases of the circulatory system: Z86.79

## 2011-03-24 LAB — POCT I-STAT 4, (NA,K, GLUC, HGB,HCT)
Glucose, Bld: 90 mg/dL (ref 70–99)
Potassium: 4 mEq/L (ref 3.5–5.1)

## 2011-03-24 LAB — POCT PREGNANCY, URINE: Preg Test, Ur: NEGATIVE

## 2011-03-24 SURGERY — CYSTOSCOPY, WITH RETROGRADE PYELOGRAM
Anesthesia: General | Site: Ureter | Laterality: Right | Wound class: Clean Contaminated

## 2011-03-24 MED ORDER — LIDOCAINE HCL (CARDIAC) 20 MG/ML IV SOLN
INTRAVENOUS | Status: DC | PRN
  Administered 2011-03-24: 100 mg via INTRAVENOUS

## 2011-03-24 MED ORDER — STERILE WATER FOR IRRIGATION IR SOLN
Status: DC | PRN
  Administered 2011-03-24: 3000 mL

## 2011-03-24 MED ORDER — FENTANYL CITRATE 0.05 MG/ML IJ SOLN: 25.0000 ug | INTRAMUSCULAR | Status: DC | PRN

## 2011-03-24 MED ORDER — ONDANSETRON HCL 4 MG/2ML IJ SOLN
INTRAMUSCULAR | Status: DC | PRN
  Administered 2011-03-24: 4 mg via INTRAVENOUS

## 2011-03-24 MED ORDER — OXYCODONE-ACETAMINOPHEN 5-325 MG PO TABS: 1.0000 | ORAL_TABLET | ORAL | Status: AC | PRN

## 2011-03-24 MED ORDER — FENTANYL CITRATE 0.05 MG/ML IJ SOLN
INTRAMUSCULAR | Status: DC | PRN
  Administered 2011-03-24 (×4): 50 ug via INTRAVENOUS

## 2011-03-24 MED ORDER — MIDAZOLAM HCL 5 MG/5ML IJ SOLN
INTRAMUSCULAR | Status: DC | PRN
  Administered 2011-03-24: 2 mg via INTRAVENOUS

## 2011-03-24 MED ORDER — CEFAZOLIN SODIUM 1-5 GM-% IV SOLN: 1.0000 g | INTRAVENOUS | Status: DC

## 2011-03-24 MED ORDER — LACTATED RINGERS IV SOLN
INTRAVENOUS | Status: DC
  Administered 2011-03-24: 100 mL/h via INTRAVENOUS

## 2011-03-24 MED ORDER — PROPOFOL 10 MG/ML IV EMUL
INTRAVENOUS | Status: DC | PRN
  Administered 2011-03-24: 200 mg via INTRAVENOUS

## 2011-03-24 MED ORDER — LACTATED RINGERS IV SOLN: INTRAVENOUS | Status: DC

## 2011-03-24 MED ORDER — PROMETHAZINE HCL 25 MG/ML IJ SOLN
6.2500 mg | INTRAMUSCULAR | Status: DC | PRN
  Administered 2011-03-24: 6.25 mg via INTRAVENOUS

## 2011-03-24 MED ORDER — DEXAMETHASONE SODIUM PHOSPHATE 4 MG/ML IJ SOLN
INTRAMUSCULAR | Status: DC | PRN
  Administered 2011-03-24: 4 mg via INTRAVENOUS

## 2011-03-24 MED ORDER — LACTATED RINGERS IV SOLN
INTRAVENOUS | Status: DC | PRN
  Administered 2011-03-24: 12:00:00 via INTRAVENOUS

## 2011-03-24 MED ORDER — MEPERIDINE HCL 25 MG/ML IJ SOLN: 6.2500 mg | INTRAMUSCULAR | Status: DC | PRN

## 2011-03-24 MED ORDER — PROMETHAZINE HCL 12.5 MG PO TABS: 12.5000 mg | ORAL_TABLET | Freq: Four times a day (QID) | ORAL | Status: AC | PRN

## 2011-03-24 MED ORDER — KETOROLAC TROMETHAMINE 30 MG/ML IJ SOLN
INTRAMUSCULAR | Status: DC | PRN
  Administered 2011-03-24: 30 mg via INTRAVENOUS

## 2011-03-24 MED ORDER — CEFAZOLIN SODIUM 1-5 GM-% IV SOLN
INTRAVENOUS | Status: DC | PRN
  Administered 2011-03-24: 1 g via INTRAVENOUS

## 2011-03-24 MED ORDER — IOHEXOL 350 MG/ML SOLN
INTRAVENOUS | Status: DC | PRN
  Administered 2011-03-24: 10 mL via INTRAVENOUS

## 2011-03-24 SURGICAL SUPPLY — 39 items
ADAPTER CATH URET PLST 4-6FR (CATHETERS) IMPLANT
ADPR CATH URET STRL DISP 4-6FR (CATHETERS)
BAG DRAIN URO-CYSTO SKYTR STRL (DRAIN) ×2 IMPLANT
BAG DRN UROCATH (DRAIN) ×1
BASKET LASER NITINOL 1.9FR (BASKET) IMPLANT
BASKET SEGURA 3FR (UROLOGICAL SUPPLIES) IMPLANT
BASKET STNLS GEMINI 4WIRE 3FR (BASKET) IMPLANT
BASKET ZERO TIP NITINOL 2.4FR (BASKET) IMPLANT
BRUSH URET BIOPSY 3F (UROLOGICAL SUPPLIES) IMPLANT
BSKT STON RTRVL 120 1.9FR (BASKET)
BSKT STON RTRVL GEM 120X11 3FR (BASKET)
BSKT STON RTRVL ZERO TP 2.4FR (BASKET)
CANISTER SUCT LVC 12 LTR MEDI- (MISCELLANEOUS) ×1 IMPLANT
CATH INTERMIT  6FR 70CM (CATHETERS) ×1 IMPLANT
CATH URET 5FR 28IN CONE TIP (BALLOONS) ×1
CATH URET 5FR 28IN OPEN ENDED (CATHETERS) IMPLANT
CATH URET 5FR 70CM CONE TIP (BALLOONS) IMPLANT
CLOTH BEACON ORANGE TIMEOUT ST (SAFETY) ×2 IMPLANT
DRAPE CAMERA CLOSED 9X96 (DRAPES) ×2 IMPLANT
ELECT REM PT RETURN 9FT ADLT (ELECTROSURGICAL)
ELECTRODE REM PT RTRN 9FT ADLT (ELECTROSURGICAL) IMPLANT
GLOVE BIO SURGEON STRL SZ7.5 (GLOVE) ×2 IMPLANT
GLOVE INDICATOR 6.5 STRL GRN (GLOVE) ×1 IMPLANT
GOWN STRL REIN XL XLG (GOWN DISPOSABLE) ×2 IMPLANT
GOWN XL W/COTTON TOWEL STD (GOWNS) ×2 IMPLANT
GUIDEWIRE 0.038 PTFE COATED (WIRE) ×2 IMPLANT
GUIDEWIRE ANG ZIPWIRE 038X150 (WIRE) IMPLANT
GUIDEWIRE STR DUAL SENSOR (WIRE) ×2 IMPLANT
KIT BALLIN UROMAX 15FX10 (LABEL) IMPLANT
KIT BALLN UROMAX 15FX4 (MISCELLANEOUS) IMPLANT
KIT BALLN UROMAX 26 75X4 (MISCELLANEOUS)
LASER FIBER DISP (UROLOGICAL SUPPLIES) IMPLANT
PACK CYSTOSCOPY (CUSTOM PROCEDURE TRAY) ×2 IMPLANT
SET HIGH PRES BAL DIL (LABEL)
SHEATH URET ACCESS 12FR/35CM (UROLOGICAL SUPPLIES) IMPLANT
SHEATH URET ACCESS 12FR/55CM (UROLOGICAL SUPPLIES) IMPLANT
STENT CONTOUR 6FRX24X.038 (STENTS) IMPLANT
STENT URET 6FRX24 CONTOUR (STENTS) ×2 IMPLANT
SYRINGE IRR TOOMEY STRL 70CC (SYRINGE) IMPLANT

## 2011-03-24 NOTE — Anesthesia Postprocedure Evaluation (Signed)
  Anesthesia Post-op Note  Patient: Brenda Gomez  Procedure(s) Performed:  CYSTOSCOPY WITH RETROGRADE PYELOGRAM - RT RPG, RT URETERAL STENT, BLADDER BIOPSY AND FULGERATION C-ARM  Patient Location: PACU  Anesthesia Type: General  Level of Consciousness: awake and alert   Airway and Oxygen Therapy: Patient Spontanous Breathing  Post-op Pain: mild  Post-op Assessment: Post-op Vital signs reviewed, Patient's Cardiovascular Status Stable, Respiratory Function Stable, Patent Airway and No signs of Nausea or vomiting  Post-op Vital Signs: stable  Complications: No apparent anesthesia complications  

## 2011-03-24 NOTE — Anesthesia Preprocedure Evaluation (Signed)
Anesthesia Evaluation  Patient identified by MRN, date of birth, ID band Patient awake    Reviewed: Allergy & Precautions, H&P , NPO status , Patient's Chart, lab work & pertinent test results  Airway Mallampati: II TM Distance: >3 FB Neck ROM: Full    Dental No notable dental hx. (+) Upper Dentures   Pulmonary neg pulmonary ROS,  clear to auscultation  Pulmonary exam normal       Cardiovascular hypertension, Pt. on medications neg cardio ROS Regular Normal    Neuro/Psych CVA (PFO small) Negative Neurological ROS  Negative Psych ROS   GI/Hepatic negative GI ROS, Neg liver ROS,   Endo/Other  Negative Endocrine ROS  Renal/GU negative Renal ROS  Genitourinary negative   Musculoskeletal negative musculoskeletal ROS (+)   Abdominal   Peds negative pediatric ROS (+)  Hematology negative hematology ROS (+)   Anesthesia Other Findings   Reproductive/Obstetrics negative OB ROS                           Anesthesia Physical Anesthesia Plan  ASA: II  Anesthesia Plan: General   Post-op Pain Management:    Induction: Intravenous  Airway Management Planned: LMA  Additional Equipment:   Intra-op Plan:   Post-operative Plan: Extubation in OR  Informed Consent: I have reviewed the patients History and Physical, chart, labs and discussed the procedure including the risks, benefits and alternatives for the proposed anesthesia with the patient or authorized representative who has indicated his/her understanding and acceptance.   Dental advisory given  Plan Discussed with: CRNA  Anesthesia Plan Comments:         Anesthesia Quick Evaluation

## 2011-03-24 NOTE — Brief Op Note (Signed)
03/24/2011  1:32 PM  PATIENT:  Brenda Gomez  51 y.o. female  PRE-OPERATIVE DIAGNOSIS:  RIGHT URETERAL STONE, MICROHEMATURIA (with atypical cytology), bilateral renal stones  POST-OPERATIVE DIAGNOSIS:  RIGHT URETERAL STONE, MICROHEMATURIA (with atypical cytology), bilateral renal stones, bladder neoplasm uncertain  PROCEDURE:  Procedure(s): CYSTOSCOPY WITH RIGHT RETROGRADE PYELOGRAM and right ureteral stent placement Bladder biopsy fulguration  SURGEON:  Surgeon(s): Antony Haste, MD   ANESTHESIA: Gen   Findings - large stone in right mid-proximal ureter, decompression after stent placed, cystic/white lesion at left trigone  EBL:   0    DRAINS: 6 x 24 cm right stent   SPECIMEN: Left trigone biopsy  DISPOSITION OF SPECIMEN: Path  DICTATION: [id number]  PLAN OF CARE: Discharge to home after PACU  PATIENT DISPOSITION:  PACU - hemodynamically stable.   Delay start of Pharmacological VTE agent (>24hrs) due to surgical blood loss or risk of bleeding:  {YES/NO/NOT APPLICABLE:20182

## 2011-03-24 NOTE — H&P (Addendum)
History of Present Illness         She follows up sooner than expected today for acute onset right flank pain. She cant get comfortable. Pain is crampy/achy and intermittent and radiating to RLQ. Pain is better today. Pain is severe at times. She's had some nausea, no emesis. She had some chill, no fever. No dysuria. Urine Cx negative last week.   UA today shows hematuria with pyuria and few bacteria.   KUB today - comparison CT March 15, 2011 - this shows the large right stone in the renal pelvis is moved down into the proximal ureter. The other right renal stone is stable as well as the left renal stones.   Past Medical History Problems  1. History of  Hypertension 401.9 2. History of  Stroke Syndrome 436  Surgical History Problems  1. History of  Appendectomy 2. History of  Lithotripsy 3. History of  Lithotripsy  Current Meds 1. Aspirin 81 MG Oral Tablet; Therapy: (Recorded:16Jan2013) to 2. Diltiazem HCl ER 90 MG Oral Capsule Extended Release 12 Hour; Therapy:  (Recorded:16Jan2013) to 3. Fish Oil CAPS; Therapy: (Recorded:16Jan2013) to 4. Lisinopril 20 MG Oral Tablet; Therapy: (Recorded:16Jan2013) to 5. Multi-Vitamin TABS; Therapy: (Recorded:16Jan2013) to 6. Nitrofurantoin Monohyd Macro 100 MG Oral Capsule; one po BID then one po daily; Therapy:  16Jan2013 to (Evaluate:02Mar2013)  Requested for: 16Jan2013; Last Rx:16Jan2013 7. Nortriptyline HCl 10 MG Oral Capsule; Therapy: (Recorded:16Jan2013) to 8. Pravastatin Sodium 20 MG Oral Tablet; Therapy: (Recorded:16Jan2013) to 9. Zyrtec 10 MG TABS; Therapy: (Recorded:16Jan2013) to  Allergies Medication  1. Codeine Derivatives  Family History Problems  1. Family history of  Cancer 2. Family history of  Diabetes Mellitus V18.0 3. Family history of  Family Health Status - Father's Age age 55 4. Family history of  Family Health Status - Mother's Age age 66 5. Family history of  Family Health Status Number Of Children 2 sons 6.  Family history of  Hypertension V17.49 7. Family history of  Kidney Cancer V16.51 8. Family history of  Nephrolithiasis 9. Family history of  Nonfunctioning Kidney  Social History Problems  1. Caffeine Use 2-3 coffee per day 2. Former Smoker V15.82 smoked 1pack per week for 31yrs quit 64yr ago 3. Marital History - Currently Married 4. Occupation: Administrator  5. History of  Alcohol Use  Vitals Vital Signs [Data Includes: Last 1 Day]  29Jan2013 04:01PM  Blood Pressure: 135 / 96 Temperature: 98.3 F Heart Rate: 91  Physical Exam Constitutional: Well nourished and well developed . No acute distress.  Pulmonary: No respiratory distress and normal respiratory rhythm and effort.  Cardiovascular: Heart rate and rhythm are normal . No peripheral edema.  Abdomen: No CVA tenderness.  Neuro/Psych:. Mood and affect are appropriate.    Results/Data Urine [Data Includes: Last 1 Day]   29Jan2013  COLOR YELLOW   APPEARANCE CLOUDY   SPECIFIC GRAVITY 1.015   pH 6.0   GLUCOSE NEG mg/dL  BILIRUBIN NEG   KETONE TRACE mg/dL  BLOOD LARGE   PROTEIN 30 mg/dL  UROBILINOGEN 0.2 mg/dL  NITRITE NEG   LEUKOCYTE ESTERASE SMALL   SQUAMOUS EPITHELIAL/HPF FEW   WBC 4-6 WBC/hpf  RBC TNTC RBC/hpf  BACTERIA FEW   CRYSTALS NONE SEEN   CASTS NONE SEEN    Assessment Assessed  1. Nephrolithiasis 592.0 2. Ureteral Stone 592.1        Right ureteral stone.   Plan Health Maintenance (V70.0)  1. UA With REFLEX  Done: 29Jan2013 03:35PM Ureteral Stone (  592.1)  2. Follow-up Schedule Surgery Office  Follow-up  Done: 29Jan2013  Discussion/Summary        Discussed KUB findings and stone likely in right ureter. Discussed with patient CT and KUB findings (we actually reviewed the images). We discussed the nature, risks and benefits of continued stone passage with medical expulsion therapy (alpha blockers), cysto and stent possible ureteroscopy, laser lithotripsy (fail to gain retrograde access, need  for repeat therapy/multiple procedures, ureteral injury, stent pain among others), and ESWL (failure to fragment, failure to pass fragments, obstruction, need for additional procedures/stenting, bleeding requiring transfusion or embolization, infection, damage to kidney/ureter/other structures, pain among others). All questions answered. Given stone size and location a reasonable approach would be to perform cystoscopy with right retrograde and ureteral stent placement followed by extracorporeal shockwave lithotripsy at a later date. All questions are answered and the patient elects to proceed with this plan. We did discuss if the stone happens to pass distally near her bladder we may consider attempted ureteroscopy. We discussed treatment goals may not be reached and/or may require multiple/repeat procedures. We discussed the likelihood of achieving the goals of the procedure and potential problems that might occur during the procedure or recuperation. I discussed with her her left kidney did excrete urine readily on her prior CT but the left kidney does appear atrophic. Therefore I would like to expedite stent placement. I discussed if she had any fevers or emesis or felt badly to immediately call my office or go to the emergency room. I told her not to disturb the nitrofurantoin, but I will send in Cipro, Lortab and Zofran.   We also discussed that we had planned on repeat cystoscopy given the inflammatory lesions in her bladder. Since she will be in the operating room we discussed the nature risks and benefits of Bladder biopsy and fulguration. All questions were answered and she elects to proceed if there are any lesions noted on cystoscopic exam.    Update I have reviewed the history and examined the patient.  There are no changes to the history and physical. She is having increased right flank pain. She has not had fever, emesis or dysuria. We discussed again stent today, staged ESWL and possible need  for repeat ESWL or URS if ESWL fails to fragment stone or fragments fail to pass.     ADD: I should add she is having itching with tramadol and hydrocodone. She would like to try oxycodone.

## 2011-03-24 NOTE — Anesthesia Procedure Notes (Signed)
Procedure Name: LMA Insertion Date/Time: 03/24/2011 1:00 PM Performed by: Iline Oven Pre-anesthesia Checklist: Patient identified, Emergency Drugs available, Suction available and Patient being monitored Patient Re-evaluated:Patient Re-evaluated prior to inductionOxygen Delivery Method: Circle System Utilized Preoxygenation: Pre-oxygenation with 100% oxygen Intubation Type: IV induction Ventilation: Mask ventilation without difficulty LMA: LMA with gastric port inserted LMA Size: 4.0 Number of attempts: 1 Placement Confirmation: positive ETCO2 Tube secured with: Tape Dental Injury: Teeth and Oropharynx as per pre-operative assessment

## 2011-03-24 NOTE — Transfer of Care (Signed)
Immediate Anesthesia Transfer of Care Note  Patient: Brenda Gomez  Procedure(s) Performed:  CYSTOSCOPY WITH RETROGRADE PYELOGRAM - RT RPG, RT URETERAL STENT, BLADDER BIOPSY AND FULGERATION C-ARM  Patient Location: PACU  Anesthesia Type: General  Level of Consciousness: awake, sedated, patient cooperative and responds to stimulation  Airway & Oxygen Therapy: Patient Spontanous Breathing and Patient connected to face mask oxygen  Post-op Assessment: Report given to PACU RN, Post -op Vital signs reviewed and stable and Patient moving all extremities  Post vital signs: Reviewed and stable  Complications: No apparent anesthesia complications

## 2011-03-25 ENCOUNTER — Encounter (HOSPITAL_BASED_OUTPATIENT_CLINIC_OR_DEPARTMENT_OTHER): Payer: Self-pay | Admitting: Urology

## 2011-03-25 NOTE — Op Note (Signed)
NAME:  Brenda Gomez, Brenda Gomez NO.:  [id number]  MEDICAL RECORD NO.:  [id number]  LOCATION:                               FACILITY:  [facility]  PHYSICIAN:  Jerilee Field, MD   DATE OF BIRTH:  1960/05/30  DATE OF PROCEDURE: DATE OF DISCHARGE:                              OPERATIVE REPORT   PREOPERATIVE DIAGNOSES: 1. Right ureteral stone. 2. Microhematuria with atypical cytology. 3. Bilateral renal stones.  POSTOPERATIVE DIAGNOSES: 1. Right ureteral stone. 2. Microhematuria with atypical cytology. 3. Bilateral renal stones. 4. Bladder neoplasm of uncertain behavior.  PROCEDURE: 1. Cystoscopy, right retrograde pyelogram, and right ureteral stent     placement. 2. Bladder biopsy with fulguration.  SURGEON:  Jerilee Field, MD  TYPE OF ANESTHESIA:  General.  INDICATION FOR PROCEDURE:  Brenda Gomez is a 51 year old who presented with microhematuria.  Cystoscopy revealed a cystic lesion that was white at her left trigone.  CT scan revealed a large right UPJ stone, a stone in the right kidney and a scar, and an atrophic left kidney with multiple stones.  Soon after her CT, she began to develop right flank pain and followed up in the office.  KUB confirmed a large UPJ stone on the right.  It passed into the ureter.  We discussed the nature, risks, benefits, and alternatives to cystoscopy with bladder biopsy, right retrograde pyelogram, right ureteral stent placement.  We have discussed given the stone size and location, we would place a stent and then could consider staged procedure with ureteroscopy or shockwave lithotripsy. She agreed to a plan with followup shockwave lithotripsy and understood we may need further procedures.  INTERPRETATION OF RIGHT RETROGRADE PYELOGRAM:  On scout imaging, large calcification was noted adjacent to the L3-L4 vertebral body junction on the right consistent with the large right ureteral stone.  There was also calcification in the  right kidney.  The right distal and mid ureter was normal.  There was a large filling defect in the mid ureter consistent with the stone and proximal to this,  dilation of the ureter, renal pelvis, and calices consistent with hydronephrosis.  There were no filling defects of the calices, renal pelvis, or proximal ureter above the stone.  Following stent placement, the bladder biopsy was done and a final scout image was obtained, which showed now decompression of the calices and renal pelvis on the right.  DESCRIPTION OF PROCEDURE:  After consent was obtained, patient brought to the operating room.  A time-out was performed to confirm the patient and procedure.  She was given preop antibiotics and SCDs were placed. After adequate anesthesia, she was placed in lithotomy position and prepped and draped in the usual fashion.  A cystoscope was passed per urethra.  The bladder was examined in its entirety.  There were no other lesions except for this cystic trigonal lesion, which was very small. The right and left ureteral orifices were in their normal position.  The right ureteral orifice was cannulated with the tip of a cone-tipped catheter and a right retrograde pyelogram was obtained with retrograde injection of contrast with findings previously dictated.  A Sensor wire was then passed up, which went past the stone,  but took some manipulation to get the ureter to straighten out and the wire to straighten out and get the wire to pass into the renal pelvis and calices, which it did.  A 6-French open-ended catheter was then passed adjacent to the stone on the wire up into the renal pelvis.  The wire was removed.  A small amount of contrast was reinjected, which confirmed placement of the wire and the lumen of the renal pelvis and into the upper pole calices.  The wire was replaced and the 6-French open-ended catheter removed.  A 6 x 24 cm stent was then advanced to the wire was removed and a  good coil was in the kidney and a good coil in the bladder.  Next, the cold cup biopsy forceps were used to biopsy the lesion on the left trigone, left floor of the bladder.  Two biopsies were taken of this area.  The area was then fulgurated with excellent hemostasis.  Specimens were sent to pathology.  The patient's bladder was drained and the scope was removed.  Another image was obtained of the right kidney. Again, the stent was in good position, but now the calices and the renal pelvis were not dilated, but came back to a normal configuration consistent with stent draining properly.  Patient was then awakened and taken to the PACU in stable condition.  DRAINS:  Right 6 x 24 cm ureteral stent.  SPECIMEN:  Left trigonal/bladder floor biopsy to pass.  ESTIMATED BLOOD LOSS:  Minimal.  COMPLICATIONS:  None.  DISPOSITION:  Stable to PACU.          ______________________________ Jerilee Field, MD     ME/MEDQ  D:  03/24/2011  T:  03/25/2011  Job:  [id number]

## 2011-03-25 NOTE — Anesthesia Postprocedure Evaluation (Signed)
  Anesthesia Post-op Note  Patient: Brenda Gomez  Procedure(s) Performed:  CYSTOSCOPY WITH RETROGRADE PYELOGRAM - RT RPG, RT URETERAL STENT, BLADDER BIOPSY AND FULGERATION C-ARM  Patient Location: PACU  Anesthesia Type: General  Level of Consciousness: awake and alert   Airway and Oxygen Therapy: Patient Spontanous Breathing  Post-op Pain: mild  Post-op Assessment: Post-op Vital signs reviewed, Patient's Cardiovascular Status Stable, Respiratory Function Stable, Patent Airway and No signs of Nausea or vomiting  Post-op Vital Signs: stable  Complications: No apparent anesthesia complications

## 2011-03-28 ENCOUNTER — Encounter (HOSPITAL_COMMUNITY): Payer: Self-pay

## 2011-03-28 ENCOUNTER — Other Ambulatory Visit: Payer: Self-pay | Admitting: Urology

## 2011-03-30 NOTE — H&P (Signed)
Urology Admission H&P    History of Present Illness: 51 yo with a 13 mm right ureteral stone. She underwent right stent placement and bladder biopsy Mar 24, 2011. She presents today for stone management with right ESWL. She is well without complaints. Bladder biopsy was benign with cystitis cystica - I discussed this with the patient.  She is well today. No fever or chills. Some stent pain.  Past Medical History  Diagnosis Date  . Hypertension   . History of cerebral embolism with infarction-  3 yrs ago,  age 14    residual left hand occasional numb/ tingle-- NEUROLOGIST  DR WILLIS-- LAST NOTE 02-25-2009 W/ CHART  . History of kidney stones   . Right ureteral stone   . Hematuria, microscopic   . Pelvic pain   . Low back pain   . Hand tingling left hand , occasional  . Renal cyst left  . Allergic rhinitis   . History of cerebrovascular accident with residual effects   . PFO (patent foramen ovale) small- per dr Anne Hahn, positive transcranial bubble study, and tee echo  . Dyslipidemia   . Headache    Past Surgical History  Procedure Date  . Nephrolithotomy 1998    LEFT KIDNEY STONE  . Extracorporeal shock wave lithotripsy 1988  &  1989    LEFT   . Appendectomy 2003  . Cardiovascular stress test 2004    NORMAL  . Transcranial doppler 11-20-2008    POSITIVE BUBBLE STUDY - SMALL INTRACARDIAC RIGHT TO LEFT SHUNT  . Transesophageal echocardiogram 01-19-2009    SMALL PFO/ NO SIGNIFICANT VALVULAR DISEASE/ EF 60-65%  . Cystoscopy w/ retrogrades 03/24/2011    Procedure: CYSTOSCOPY WITH RETROGRADE PYELOGRAM;  Surgeon: Antony Haste, MD;  Location: [facility];  Service: Urology;  Laterality: Right;  RT RPG, RT URETERAL STENT, BLADDER BIOPSY AND FULGERATION C-ARM    Home Medications:  reviewed No prescriptions prior to admission   Allergies:  Allergies  Allergen Reactions  . Codeine Swelling    No family history on file. Social History:  reports that she  quit smoking about a year ago. Her smoking use included Cigarettes. She has a 5 pack-year smoking history. She has never used smokeless tobacco. She reports that she does not drink alcohol or use illicit drugs.  Review of Systems  All other systems reviewed and are negative.    Physical Exam:  Vital signs in last 24 hours: reviewed AFVSS   Physical Exam  Constitutional: She is oriented to person, place, and time. She appears well-developed and well-nourished.  Eyes: Pupils are equal, round, and reactive to light. No scleral icterus.  Neck: Normal range of motion.  Cardiovascular: Normal rate and regular rhythm.   Respiratory: Effort normal. No respiratory distress.  Musculoskeletal: Normal range of motion.  Neurological: She is alert and oriented to person, place, and time.  Skin: Skin is warm and dry.    Laboratory Data:  No results found for this or any previous visit (from the past 24 hour(s)). No results found for this or any previous visit (from the past 240 hour(s)). Creatinine: No results found for this basename: CREATININE:7 in the last 168 hours  Impression/Assessment:  Right ureteral stone Plan:  Right ESWL - I discussed with the patient the nature, potential benefits, risks and alternatives to right ESWL, including side effects of the proposed treatment, the likelihood of the patient achieving the goals of the procedure, and any potential problems that might occur during the  procedure or recuperation. All questions answered. Patient elects to proceed. We discussed f/u next week with KUB for stent removal if stone adequately fragmented. Discussed may need staged procedure or f/u ureteroscopy.

## 2011-03-30 NOTE — Progress Notes (Signed)
Pt instructed no aspirin or NSAIDS until after procedure. Reminded of laxative day before procedure. NPO after midnight. To take calcium channel blocker and BP meds AM of procedure. Needs responsible person to drive her home.

## 2011-03-31 ENCOUNTER — Encounter (HOSPITAL_COMMUNITY)
Admission: RE | Disposition: A | Discharge status: Home / Self Care | Payer: Self-pay | Source: Ambulatory Visit | Attending: Urology

## 2011-03-31 ENCOUNTER — Ambulatory Visit (HOSPITAL_COMMUNITY)
Admission: RE | Admit: 2011-03-31 | Discharge: 2011-03-31 | Disposition: A | Discharge status: Home / Self Care | Payer: 59 | Source: Ambulatory Visit | Attending: Urology | Admitting: Urology

## 2011-03-31 ENCOUNTER — Encounter (HOSPITAL_COMMUNITY): Payer: Self-pay | Admitting: *Deleted

## 2011-03-31 ENCOUNTER — Ambulatory Visit (HOSPITAL_COMMUNITY): Payer: 59

## 2011-03-31 DIAGNOSIS — Z01818 Encounter for other preprocedural examination: Secondary | ICD-10-CM | POA: Insufficient documentation

## 2011-03-31 DIAGNOSIS — N201 Calculus of ureter: Secondary | ICD-10-CM | POA: Insufficient documentation

## 2011-03-31 DIAGNOSIS — E785 Hyperlipidemia, unspecified: Secondary | ICD-10-CM | POA: Insufficient documentation

## 2011-03-31 DIAGNOSIS — Z8673 Personal history of transient ischemic attack (TIA), and cerebral infarction without residual deficits: Secondary | ICD-10-CM | POA: Insufficient documentation

## 2011-03-31 DIAGNOSIS — I10 Essential (primary) hypertension: Secondary | ICD-10-CM | POA: Insufficient documentation

## 2011-03-31 SURGERY — LITHOTRIPSY, ESWL
Anesthesia: LOCAL | Laterality: Right

## 2011-03-31 MED ORDER — DIAZEPAM 5 MG PO TABS
ORAL_TABLET | ORAL | Status: AC
  Filled 2011-03-31: qty 2

## 2011-03-31 MED ORDER — DEXTROSE-NACL 5-0.45 % IV SOLN: INTRAVENOUS | Status: DC

## 2011-03-31 MED ORDER — CIPROFLOXACIN HCL 500 MG PO TABS
ORAL_TABLET | ORAL | Status: AC
  Filled 2011-03-31: qty 1

## 2011-03-31 MED ORDER — ACETAMINOPHEN 10 MG/ML IV SOLN: 1000.0000 mg | Freq: Four times a day (QID) | INTRAVENOUS | Status: DC

## 2011-03-31 MED ORDER — DIPHENHYDRAMINE HCL 25 MG PO CAPS
25.0000 mg | ORAL_CAPSULE | ORAL | Status: AC
  Administered 2011-03-31: 25 mg via ORAL

## 2011-03-31 MED ORDER — IBUPROFEN 200 MG PO TABS: 200.0000 mg | ORAL_TABLET | Freq: Four times a day (QID) | ORAL | Status: DC | PRN

## 2011-03-31 MED ORDER — CIPROFLOXACIN HCL 500 MG PO TABS
500.0000 mg | ORAL_TABLET | ORAL | Status: AC
  Administered 2011-03-31: 500 mg via ORAL

## 2011-03-31 MED ORDER — DIAZEPAM 5 MG PO TABS
10.0000 mg | ORAL_TABLET | ORAL | Status: AC
  Administered 2011-03-31: 10 mg via ORAL

## 2011-03-31 MED ORDER — DIPHENHYDRAMINE HCL 25 MG PO CAPS
ORAL_CAPSULE | ORAL | Status: AC
  Filled 2011-03-31: qty 1

## 2011-03-31 MED ORDER — OXYCODONE-ACETAMINOPHEN 5-325 MG PO TABS: 1.0000 | ORAL_TABLET | ORAL | Status: AC | PRN

## 2011-03-31 MED ORDER — CIPROFLOXACIN HCL 500 MG PO TABS: 250.0000 mg | ORAL_TABLET | Freq: Two times a day (BID) | ORAL | Status: AC

## 2011-03-31 MED ORDER — ASPIRIN 81 MG PO TABS: 81.0000 mg | ORAL_TABLET | Freq: Every day | ORAL | Status: AC

## 2011-05-11 ENCOUNTER — Encounter: Payer: Self-pay | Admitting: Internal Medicine

## 2011-05-16 ENCOUNTER — Other Ambulatory Visit: Payer: Self-pay | Admitting: Internal Medicine

## 2011-06-21 ENCOUNTER — Other Ambulatory Visit: Payer: Self-pay | Admitting: Internal Medicine

## 2011-07-12 ENCOUNTER — Ambulatory Visit (INDEPENDENT_AMBULATORY_CARE_PROVIDER_SITE_OTHER): Payer: 59 | Admitting: Internal Medicine

## 2011-07-12 ENCOUNTER — Encounter: Payer: Self-pay | Admitting: Internal Medicine

## 2011-07-12 VITALS — BP 120/73 | HR 74 | Temp 98.3°F | Ht 58.08 in | Wt 137.0 lb

## 2011-07-12 DIAGNOSIS — E785 Hyperlipidemia, unspecified: Secondary | ICD-10-CM

## 2011-07-12 DIAGNOSIS — Z0000 Encounter for general adult medical examination without abnormal findings: Secondary | ICD-10-CM

## 2011-07-12 DIAGNOSIS — I10 Essential (primary) hypertension: Secondary | ICD-10-CM

## 2011-07-12 LAB — HEPATIC FUNCTION PANEL
Alkaline Phosphatase: 46 U/L (ref 39–117)
Bilirubin, Direct: 0 mg/dL (ref 0.0–0.3)

## 2011-07-12 LAB — LIPID PANEL
Cholesterol: 167 mg/dL (ref 0–200)
LDL Cholesterol: 94 mg/dL (ref 0–99)
Total CHOL/HDL Ratio: 3

## 2011-07-12 LAB — CBC WITH DIFFERENTIAL/PLATELET
Basophils Relative: 0.4 % (ref 0.0–3.0)
Eosinophils Absolute: 0.1 10*3/uL (ref 0.0–0.7)
Hemoglobin: 14.1 g/dL (ref 12.0–15.0)
Lymphocytes Relative: 21.9 % (ref 12.0–46.0)
MCHC: 33.4 g/dL (ref 30.0–36.0)
Monocytes Relative: 5 % (ref 3.0–12.0)
Neutro Abs: 8 10*3/uL — ABNORMAL HIGH (ref 1.4–7.7)
Neutrophils Relative %: 71.6 % (ref 43.0–77.0)
RBC: 4.63 Mil/uL (ref 3.87–5.11)
WBC: 11.2 10*3/uL — ABNORMAL HIGH (ref 4.5–10.5)

## 2011-07-12 LAB — BASIC METABOLIC PANEL
Calcium: 9.1 mg/dL (ref 8.4–10.5)
Creatinine, Ser: 0.7 mg/dL (ref 0.4–1.2)
GFR: 99.56 mL/min (ref >60.00)
Sodium: 141 mEq/L (ref 135–145)

## 2011-07-12 LAB — TSH: TSH: 1.37 u[IU]/mL (ref 0.35–5.50)

## 2011-07-12 MED ORDER — PRAVASTATIN SODIUM 20 MG PO TABS: 10.0000 mg | ORAL_TABLET | Freq: Every day | ORAL | Status: DC

## 2011-07-12 MED ORDER — DILTIAZEM HCL 90 MG PO TABS: ORAL_TABLET | ORAL | Status: DC

## 2011-07-12 MED ORDER — LISINOPRIL 20 MG PO TABS: ORAL_TABLET | ORAL | Status: DC

## 2011-07-12 NOTE — Progress Notes (Signed)
  Subjective:    Patient ID: Brenda Gomez, female    DOB: 03-21-1960, 51 y.o.   MRN: [id number]  HPI  Brenda Gomez is here for a physical; she has no acute issues.      Review of Systems HYPERTENSION: Disease Monitoring: Blood pressure range-120/80-120/88 Chest pain, palpitations- no      Dyspnea- no Medications: Compliance- yes  Lightheadedness,Syncope- no    Edema- no  FASTING HYPERGLYCEMIA, PMH of: Disease Monitoring: Blood Sugar ranges-no monitor  Polyuria/phagia/dipsia- no       Visual problems- no FH: DM in [employer]  HYPERLIPIDEMIA: Disease Monitoring: See symptoms for Hypertension Medications: Compliance- yes  Abd pain, bowel changes-no   Muscle aches- no; but her paresthesias in the left hand seem to be worse when she is fatigued             Objective:   Physical Exam Gen.: Healthy and well-nourished in appearance. Alert, appropriate and cooperative throughout exam. Head: Normocephalic without obvious abnormalities Eyes: No corneal or conjunctival inflammation noted. Pupils equal round reactive to light and accommodation. Fundal exam is benign without hemorrhages, exudate, papilledema. Extraocular motion intact. Vision grossly normal. Ears: External  ear exam reveals no significant lesions or deformities. Canals clear .TMs normal. Hearing is grossly normal bilaterally. Nose: External nasal exam reveals no deformity or inflammation. Nasal mucosa are pink and moist. No lesions or exudates noted.   Mouth: Oral mucosa and oropharynx reveal no lesions or exudates. Upper plate & lower partial Neck: No deformities, masses, or tenderness noted. Range of motion &  Thyroid normal. Lungs: Normal respiratory effort; chest expands symmetrically. Lungs are clear to auscultation without rales, wheezes, or increased work of breathing. Heart: Normal rate and rhythm. Normal S1 and S2. No gallop, click, or rub. Soft S4 w/o murmur. Abdomen: Bowel sounds normal;  abdomen soft and nontender. No masses, organomegaly or hernias noted. Genitalia: Dr Arelia Sneddon                 Musculoskeletal/extremities: No deformity or scoliosis noted of  the thoracic or lumbar spine. No clubbing, cyanosis, edema, or deformity noted. Range of motion  normal .Tone & strength  normal.Joints normal. Nail health  good. Vascular: Carotid, radial artery, dorsalis pedis and  posterior tibial pulses are full and equal. No bruits present. Neurologic: Alert and oriented x3. Deep tendon reflexes symmetrical and normal.  Light touch & Tinel's testing normal of hands.          Skin: Intact without suspicious lesions or rashes. Lymph: No cervical, axillary lymphadenopathy present. Psych: Mood and affect are normal. Normally interactive                                                                                         Assessment & Plan:  #1 comprehensive physical exam; no acute findings #2 see Problem List with Assessments & Recommendations Plan: see Orders

## 2011-07-12 NOTE — Patient Instructions (Signed)
Preventive Health Care: Exercise  30-45  minutes a day, 3-4 days a week. Walking is especially valuable in preventing Osteoporosis. Eat a low-fat diet with lots of fruits and vegetables, up to 7-9 servings per day. Consume less than 30 grams of sugar per day from foods & drinks with High Fructose Corn Syrup as # 1,2,3 or #4 on label. Health Care Power of Attorney & Living Will place you in charge of your health care  decisions. Verify these are  in place. Blood Pressure Goal  Ideally is an AVERAGE < 135/85. This AVERAGE should be calculated from @ least 5-7 BP readings taken @ different times of day on different days of week. You should not respond to isolated BP readings , but rather the AVERAGE for that week . Please try to go on My Chart within the next 24 hours to allow me to release the results directly to you. Please try to go on My Chart within the next 24 hours to allow me to release the results directly to you.

## 2011-08-18 ENCOUNTER — Encounter: Payer: Self-pay | Admitting: Internal Medicine

## 2011-11-17 ENCOUNTER — Other Ambulatory Visit: Payer: Self-pay | Admitting: Internal Medicine

## 2011-11-17 NOTE — Telephone Encounter (Signed)
Rx sent.    MW 

## 2012-03-29 IMAGING — CR DG ABDOMEN 1V
1 series · 1 of 1 positions shown · non-contrast
Comparison: None.

CLINICAL DATA: Pre lithotripsy, right-sided calculus

ABDOMEN - 1 VIEW

[t abdomen supine]
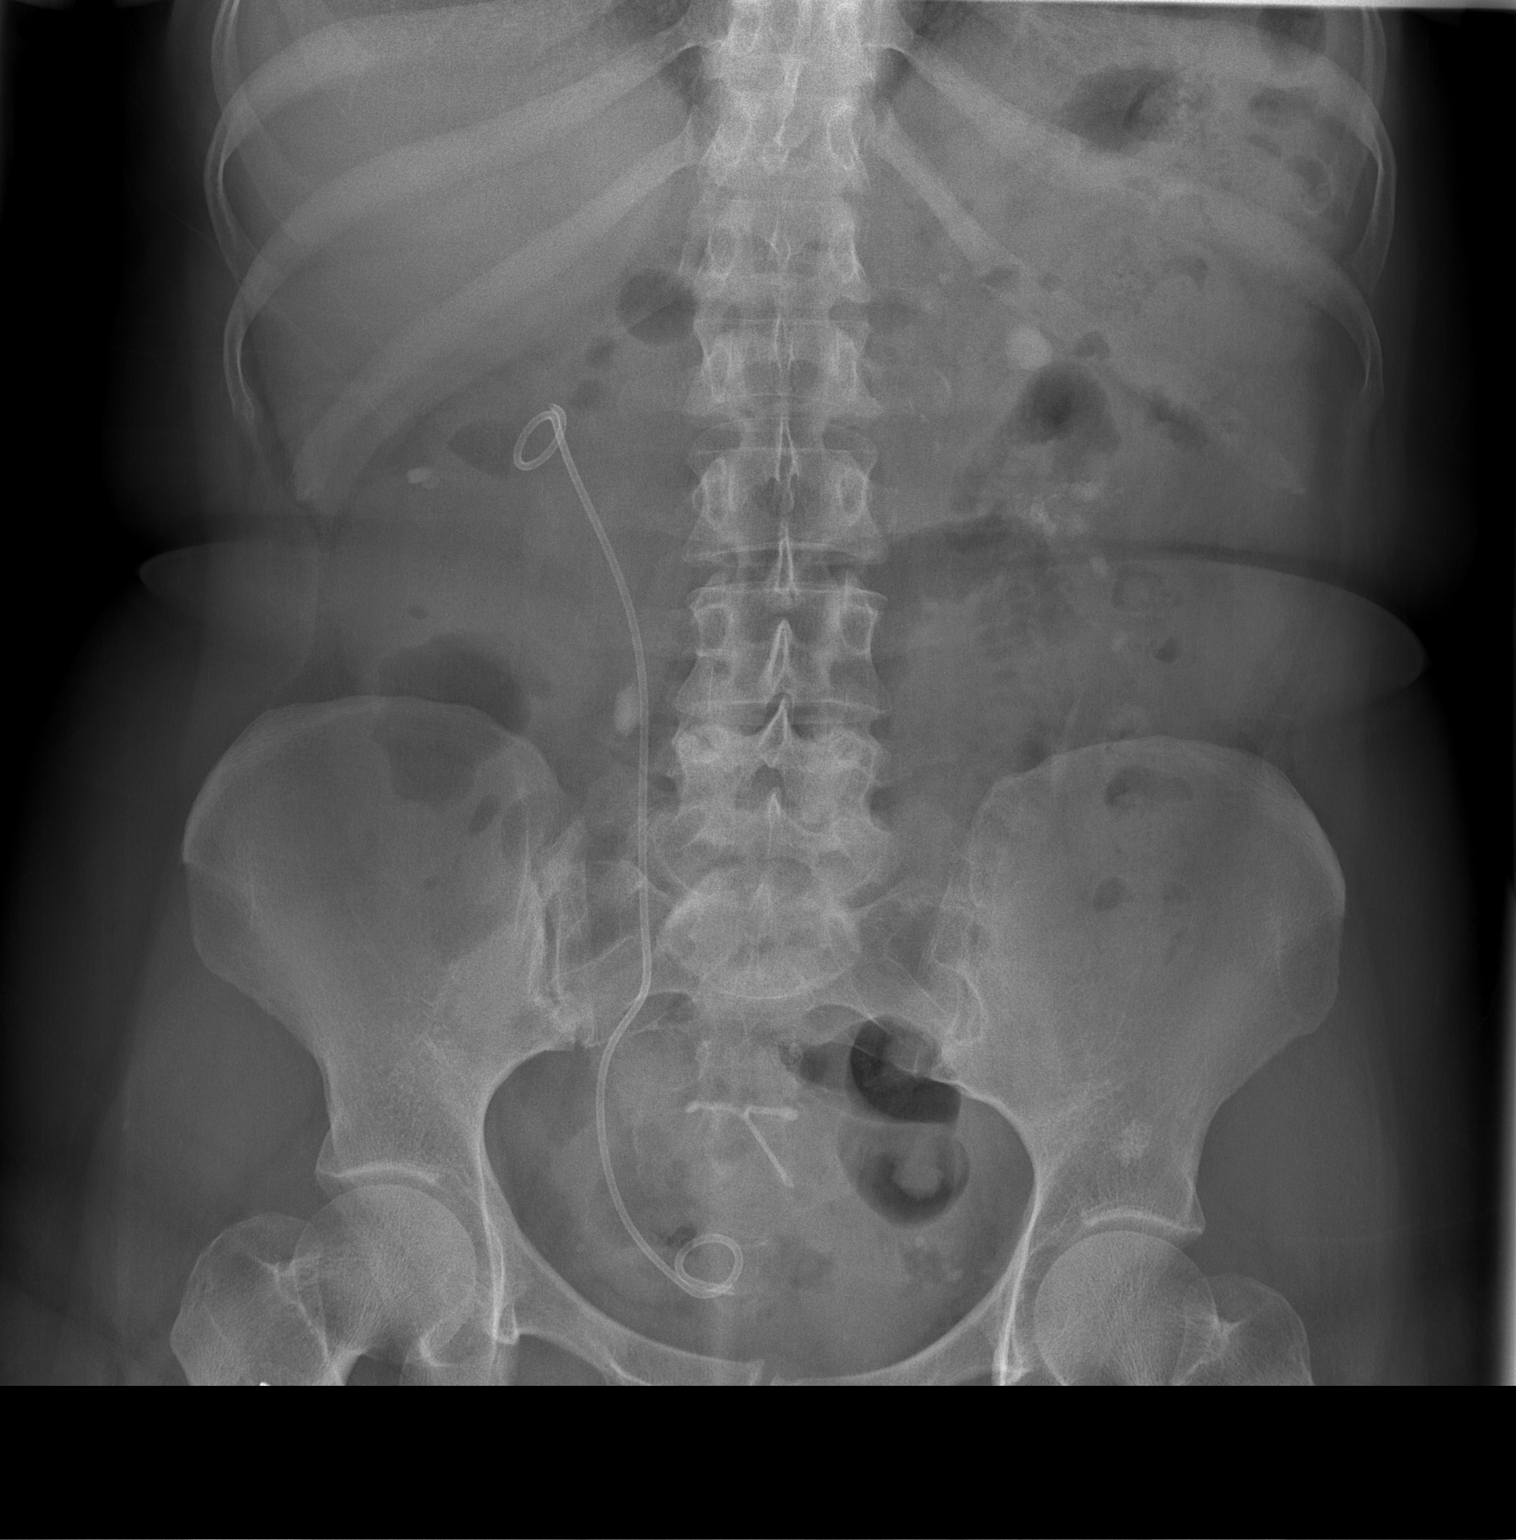

[1 of 1 positions shown; findings below may reference images not displayed]

FINDINGS: Bilateral renal calculi present.  In addition there is a
large calcification along the course of the proximal right ureter
measuring 7 x 15 mm consistent with a proximal right ureteral
calculus.  A right double-J ureteral stent is present.  IUD is
noted.  The bowel gas pattern is nonspecific.
IMPRESSION: 7 x 15 mm mid proximal right ureteral calculus with right double-J
stent .  Bilateral renal calculi are present.

## 2012-04-07 ENCOUNTER — Other Ambulatory Visit: Payer: Self-pay

## 2012-08-05 ENCOUNTER — Other Ambulatory Visit: Payer: Self-pay | Admitting: Internal Medicine

## 2012-08-07 NOTE — Telephone Encounter (Signed)
Schedule CPX/Fasting labs

## 2012-08-12 ENCOUNTER — Other Ambulatory Visit: Payer: Self-pay | Admitting: Internal Medicine

## 2012-08-21 LAB — HM MAMMOGRAPHY

## 2012-08-22 ENCOUNTER — Encounter: Payer: Self-pay | Admitting: Internal Medicine

## 2012-09-17 ENCOUNTER — Other Ambulatory Visit: Payer: Self-pay | Admitting: Internal Medicine

## 2012-09-18 NOTE — Telephone Encounter (Signed)
Pending appointment 10/2012 

## 2012-09-20 ENCOUNTER — Other Ambulatory Visit: Payer: Self-pay | Admitting: Internal Medicine

## 2012-10-15 ENCOUNTER — Other Ambulatory Visit: Payer: Self-pay | Admitting: *Deleted

## 2012-10-15 DIAGNOSIS — I10 Essential (primary) hypertension: Secondary | ICD-10-CM

## 2012-10-15 MED ORDER — LISINOPRIL 20 MG PO TABS: ORAL_TABLET | ORAL | Status: DC

## 2012-10-15 NOTE — Telephone Encounter (Signed)
Rx was refilled for Lisinopril 20 mg.  Ag cma

## 2012-11-08 ENCOUNTER — Encounter: Payer: Self-pay | Admitting: Internal Medicine

## 2012-11-08 ENCOUNTER — Ambulatory Visit (INDEPENDENT_AMBULATORY_CARE_PROVIDER_SITE_OTHER): Payer: 59 | Admitting: Internal Medicine

## 2012-11-08 VITALS — BP 118/80 | HR 60 | Temp 98.8°F | Wt 135.4 lb

## 2012-11-08 DIAGNOSIS — M79671 Pain in right foot: Secondary | ICD-10-CM

## 2012-11-08 DIAGNOSIS — E782 Mixed hyperlipidemia: Secondary | ICD-10-CM

## 2012-11-08 DIAGNOSIS — Z0000 Encounter for general adult medical examination without abnormal findings: Secondary | ICD-10-CM

## 2012-11-08 DIAGNOSIS — M79609 Pain in unspecified limb: Secondary | ICD-10-CM

## 2012-11-08 DIAGNOSIS — M542 Cervicalgia: Secondary | ICD-10-CM

## 2012-11-08 LAB — HEPATIC FUNCTION PANEL
ALT: 24 U/L (ref 0–35)
Albumin: 4.3 g/dL (ref 3.5–5.2)
Total Protein: 7.3 g/dL (ref 6.0–8.3)

## 2012-11-08 LAB — CBC WITH DIFFERENTIAL/PLATELET
Basophils Relative: 0.5 % (ref 0.0–3.0)
Eosinophils Relative: 2.1 % (ref 0.0–5.0)
HCT: 41.3 % (ref 36.0–46.0)
Lymphs Abs: 2 10*3/uL (ref 0.7–4.0)
MCV: 90.3 fl (ref 78.0–100.0)
Monocytes Absolute: 0.5 10*3/uL (ref 0.1–1.0)
Neutro Abs: 5 10*3/uL (ref 1.4–7.7)
Platelets: 233 10*3/uL (ref 150.0–400.0)
WBC: 7.7 10*3/uL (ref 4.5–10.5)

## 2012-11-08 LAB — BASIC METABOLIC PANEL
BUN: 15 mg/dL (ref 6–23)
Chloride: 106 mEq/L (ref 96–112)
Potassium: 3.9 mEq/L (ref 3.5–5.1)

## 2012-11-08 LAB — LIPID PANEL
Cholesterol: 175 mg/dL (ref 0–200)
Triglycerides: 77 mg/dL (ref 0.0–149.0)

## 2012-11-08 MED ORDER — DILTIAZEM HCL 90 MG PO TABS: ORAL_TABLET | ORAL | Status: DC

## 2012-11-08 MED ORDER — PRAVASTATIN SODIUM 20 MG PO TABS: ORAL_TABLET | ORAL | Status: DC

## 2012-11-08 MED ORDER — GABAPENTIN 100 MG PO CAPS: ORAL_CAPSULE | ORAL | Status: DC

## 2012-11-08 MED ORDER — LISINOPRIL 20 MG PO TABS: ORAL_TABLET | ORAL | Status: DC

## 2012-11-08 NOTE — Patient Instructions (Signed)
Preventive Health Care: Exercise  30-45  minutes a day, 3-4 days a week. Walking is especially valuable in preventing Osteoporosis. Eat a low-fat diet with lots of fruits and vegetables, up to 7-9 servings per day. This would eliminate need for vitamin supplements for most individuals. Consume less than 30 grams of sugar per day from foods & drinks with High Fructose Corn Syrup as #2,3 or #4 on label. The legal document "Health Care Power of Attorney & Living Will " verifies decisions concerning your health care. Massage therapy for the upper back and neck muscles would be of benefit and would be medically indicated. As per the Standard of Care , screening Colonoscopy recommended @ 50 & every 5-10 years thereafter . More frequent monitor would be dictated by family history or findings @ Colonoscopy. Please let me know when you wish to be referred for screening colonoscopy and evaluation of the foot pain by Dr. Ayesha Mohair

## 2012-11-08 NOTE — Progress Notes (Signed)
Subjective:    Patient ID: Brenda Gomez, female    DOB: [birth year], 52 y.o.   MRN: [id number]  HPI  She is here for a physical;acute issues include R sided neck pain, ? related to computer placement.     Review of Systems   The dull discomfort in the neck is associated with some occipital headaches. This had been a chronic problem in the past. Her neurologist had prescribed nortriptyline for those headaches with relief. Chronic, intermittent pain in the right foot along the anterior lateral aspect.  She is on modified a heart healthy diet; she is not  exercising. She denies chest pain, palpitations,  or claudication. She has some DOE when exercising.  Family history is positive for premature CVA in her mother . Advanced cholesterol testing reveals her LDL goal is less than 115; ideally < 85. There is medication compliance with the statin. Significant abdominal symptoms, memory deficit, or myalgias denied. BP @ home averages< 120/80. .     Objective:   Physical Exam  Gen.: Healthy and well-nourished in appearance. Alert, appropriate and cooperative throughout exam.Appears younger than stated age  Head: Normocephalic without obvious abnormalities  Eyes: No corneal or conjunctival inflammation noted. Pupils equal round reactive to light and accommodation.  Extraocular motion intact. Vision grossly normal without lenses Ears: External  ear exam reveals no significant lesions or deformities. Canals clear .TMs normal. Hearing is grossly normal bilaterally. Nose: External nasal exam reveals no deformity or inflammation. Nasal mucosa are pink and moist. No lesions or exudates noted.  Mouth: Oral mucosa and oropharynx reveal no lesions or exudates. Upper plate & lower partial. Neck: No deformities, masses, or tenderness noted. Range of motion slightly decreased to L. Thyroid normal. Lungs: Normal respiratory effort; chest expands symmetrically. Lungs are clear to auscultation without rales,  wheezes, or increased work of breathing. Heart: Normal rate and rhythm. Normal S1 and S2. No gallop, click, or rub. S4 w/o murmur. Abdomen: Bowel sounds normal; abdomen soft and nontender. No masses, organomegaly or hernias noted. Genitalia: As per Gyn                                  Musculoskeletal/extremities: No deformity or scoliosis noted of  the thoracic or lumbar spine.  No clubbing, cyanosis, edema, or significant extremity  deformity noted. Range of motion normal .Tone & strength  Normal. Joints normal . Nail health good. Able to lie down & sit up w/o help. Negative SLR bilaterally Vascular: Carotid, radial artery, dorsalis pedis and  posterior tibial pulses are full and equal. No bruits present. Neurologic: Alert and oriented x3. Deep tendon reflexes symmetrical and normal.       Skin: Intact without suspicious lesions or rashes. Lymph: No cervical, axillary lymphadenopathy present. Psych: Mood and affect are normal. Normally interactive                                                                                        Assessment & Plan:  #1 comprehensive physical exam; no acute findings #2 cervicomyalgia  #3 right foot pain, chronic, recurrent  Plan: see Orders  & Recommendations

## 2012-12-27 ENCOUNTER — Other Ambulatory Visit: Payer: Self-pay

## 2013-01-04 ENCOUNTER — Encounter: Payer: Self-pay | Admitting: Family Medicine

## 2013-01-04 ENCOUNTER — Ambulatory Visit (INDEPENDENT_AMBULATORY_CARE_PROVIDER_SITE_OTHER): Payer: 59 | Admitting: Family Medicine

## 2013-01-04 VITALS — BP 120/86 | HR 83 | Temp 99.2°F | Wt 136.4 lb

## 2013-01-04 DIAGNOSIS — J019 Acute sinusitis, unspecified: Secondary | ICD-10-CM | POA: Insufficient documentation

## 2013-01-04 DIAGNOSIS — J0100 Acute maxillary sinusitis, unspecified: Secondary | ICD-10-CM

## 2013-01-04 MED ORDER — AMOXICILLIN 875 MG PO TABS: 875.0000 mg | ORAL_TABLET | Freq: Two times a day (BID) | ORAL | Status: DC

## 2013-01-04 MED ORDER — PROMETHAZINE-DM 6.25-15 MG/5ML PO SYRP: 5.0000 mL | ORAL_SOLUTION | Freq: Four times a day (QID) | ORAL | Status: DC | PRN

## 2013-01-04 NOTE — Patient Instructions (Signed)
Follow up as needed Start the Amoxicillin twice daily- take w/ food Drink plenty of fluids Tylenol or ibuprofen for pain/fever REST! Use the cough syrup for night and weekend cough- will cause drowsiness Call with any questions or concerns Hang in there!!!

## 2013-01-04 NOTE — Progress Notes (Signed)
  Subjective:    Patient ID: Brenda Gomez, female    DOB: [birth year], 52 y.o.   MRN: [id number]  HPI URI- sxs started 10 days ago w/ cold like sxs; nasal congestion, sore throat, cough.  Multiple sick contacts.  + sinus pressure.  Bilateral ear fullness.  Low grade temps.  + chills.  No N/V/D.  + cough- intermittently productive.   Review of Systems For ROS see HPI     Objective:   Physical Exam  Vitals reviewed. Constitutional: She appears well-developed and well-nourished. No distress.  HENT:  Head: Normocephalic and atraumatic.  Right Ear: Tympanic membrane normal.  Left Ear: Tympanic membrane normal.  Nose: Mucosal edema and rhinorrhea present. Right sinus exhibits maxillary sinus tenderness and frontal sinus tenderness. Left sinus exhibits maxillary sinus tenderness and frontal sinus tenderness.  Mouth/Throat: Uvula is midline and mucous membranes are normal. Posterior oropharyngeal erythema present. No oropharyngeal exudate.  Eyes: Conjunctivae and EOM are normal. Pupils are equal, round, and reactive to light.  Neck: Normal range of motion. Neck supple.  Cardiovascular: Normal rate, regular rhythm and normal heart sounds.   Pulmonary/Chest: Effort normal and breath sounds normal. No respiratory distress. She has no wheezes.  Lymphadenopathy:    She has no cervical adenopathy.          Assessment & Plan:

## 2013-01-04 NOTE — Progress Notes (Signed)
Pre visit review using our clinic review tool, if applicable. No additional management support is needed unless otherwise documented below in the visit note. 

## 2013-01-04 NOTE — Assessment & Plan Note (Signed)
New.  Pt's sxs and PE consistent w/ infxn.  Start abx.  Cough meds prn.  Reviewed supportive care and red flags that should prompt return.  Pt expressed understanding and is in agreement w/ plan.  

## 2013-11-13 ENCOUNTER — Encounter: Payer: Self-pay | Admitting: Internal Medicine

## 2013-11-19 ENCOUNTER — Other Ambulatory Visit: Payer: Self-pay

## 2013-11-19 MED ORDER — LISINOPRIL 20 MG PO TABS: ORAL_TABLET | ORAL | Status: DC

## 2013-12-23 ENCOUNTER — Ambulatory Visit (INDEPENDENT_AMBULATORY_CARE_PROVIDER_SITE_OTHER): Payer: 59 | Admitting: Internal Medicine

## 2013-12-23 ENCOUNTER — Encounter: Payer: Self-pay | Admitting: Internal Medicine

## 2013-12-23 VITALS — BP 136/92 | HR 77 | Temp 98.5°F | Resp 14 | Wt 137.5 lb

## 2013-12-23 DIAGNOSIS — R739 Hyperglycemia, unspecified: Secondary | ICD-10-CM

## 2013-12-23 DIAGNOSIS — E782 Mixed hyperlipidemia: Secondary | ICD-10-CM

## 2013-12-23 DIAGNOSIS — I10 Essential (primary) hypertension: Secondary | ICD-10-CM

## 2013-12-23 DIAGNOSIS — R209 Unspecified disturbances of skin sensation: Secondary | ICD-10-CM

## 2013-12-23 MED ORDER — LISINOPRIL 20 MG PO TABS: ORAL_TABLET | ORAL | Status: DC

## 2013-12-23 MED ORDER — DILTIAZEM HCL 90 MG PO TABS: ORAL_TABLET | ORAL | Status: DC

## 2013-12-23 NOTE — Progress Notes (Signed)
Subjective:    Patient ID: Brenda Gomez, female    DOB: 08-Feb-1961, 53 y.o.   MRN: [id number]  HPI   She is here for follow-up of her hypertension and dyslipidemia.  She's been compliant with the medicines without adverse effects  Blood pressures range is 130 over 80s at home. She's had a cough but this started before she went on lisinopril. She does not wish to change the medication. She believes is related to postnasal drainage  She exercises irregularly: She is not exercising recently; but she denies active cardio pulmonary symptoms    She is on modified heart healthy diet. She does not add salt    Review of Systems  Significant headaches, epistaxis, chest pain, palpitations, exertional dyspnea, claudication, paroxysmal nocturnal dyspnea, or edema absent. No  memory loss or myalgias  She has had bilateral numbness in her hands at night.  She has occasional ringworm and does not feel that the topical agents are always effective. She has no active dermatologic issues at this time  She has no significant GI symptoms other than she does describe gas after every meal since this past Summer. She is overdue for colonoscopy.       Objective:   Physical Exam Gen.: Healthy and well-nourished in appearance. Alert, appropriate and cooperative throughout exam. Appears younger than stated age  Head: Normocephalic without obvious abnormalities  Eyes: No corneal or conjunctival inflammation noted. Pupils equal round reactive to light and accommodation. Extraocular motion intact.  Ears: External  ear exam reveals no significant lesions or deformities. Canals clear .TMs normal. Hearing is grossly normal bilaterally. Nose: External nasal exam reveals no deformity or inflammation. Nasal mucosa are pink and moist. No lesions or exudates noted.   Mouth: Oral mucosa and oropharynx reveal no lesions or exudates. Teeth in good repair. Neck: No deformities, masses, or tenderness noted. Range of  motion & Thyroidnormal Lungs: Normal respiratory effort; chest expands symmetrically. Lungs are clear to auscultation without rales, wheezes, or increased work of breathing. Heart: Normal rate and rhythm. Normal S1 and S2. No gallop, click, or rub. No murmur. Abdomen: Bowel sounds normal; abdomen soft and nontender. No masses, organomegaly or hernias noted. Genitalia: as per Gyn                                  Musculoskeletal/extremities: No deformity or scoliosis noted of  the thoracic or lumbar spine.  No clubbing, cyanosis, edema, or significant extremity  deformity noted. Range of motion normal .Tone & strength normal. Hand joints normal  Fingernail health good. Able to lie down & sit up w/o help. Negative SLR bilaterally Vascular: Carotid, radial artery, dorsalis pedis and  posterior tibial pulses are full and equal. No bruits present. Neurologic: Alert and oriented x3. Deep tendon reflexes symmetrical and normal. Equivocal Tinel's on L Gait normal .       Skin: Intact without suspicious lesions or rashes. Lymph: No cervical, axillary lymphadenopathy present. Psych: Mood and affect are normal. Normally interactive  Assessment & Plan:  #1 See Current Assessment & Plan in Problem List under specific Diagnosis #2 possible CTS bilaterally #3 cough due to rhinitis

## 2013-12-23 NOTE — Patient Instructions (Signed)
As per the Standard of Care , screening Colonoscopy recommended @ 50 & every 5-10 years thereafter . More frequent monitor would be dictated by family history or findings @ Colonoscopy.    Plain Mucinex (NOT D) for thick secretions ;force NON dairy fluids .   Nasal cleansing in the shower as discussed with lather of mild shampoo.After 10 seconds wash off lather while  exhaling through nostrils. Make sure that all residual soap is removed to prevent irritation.  Flonase OR Nasacort AQ 1 spray in each nostril twice a day as needed. Use the "crossover" technique into opposite nostril spraying toward opposite ear @ 45 degree angle, not straight up into nostril.  Use a Neti pot daily only  as needed for significant sinus congestion; going from open side to congested side . Plain Allegra (NOT D )  160 daily , Loratidine 10 mg , OR Zyrtec 10 mg @ bedtime  as needed for itchy eyes & sneezing.

## 2013-12-23 NOTE — Assessment & Plan Note (Addendum)
Lipoprofile, LFTs, TSH 

## 2013-12-23 NOTE — Assessment & Plan Note (Signed)
Blood pressure goals reviewed. BMET 

## 2013-12-23 NOTE — Assessment & Plan Note (Signed)
A1c

## 2013-12-23 NOTE — Assessment & Plan Note (Signed)
Trial of nocturnal wrist splints TSH

## 2013-12-24 ENCOUNTER — Other Ambulatory Visit: Payer: Self-pay | Admitting: Internal Medicine

## 2013-12-24 DIAGNOSIS — Z1211 Encounter for screening for malignant neoplasm of colon: Secondary | ICD-10-CM

## 2013-12-31 ENCOUNTER — Encounter: Payer: Self-pay | Admitting: Internal Medicine

## 2014-01-02 ENCOUNTER — Encounter: Payer: 59 | Admitting: Internal Medicine

## 2014-01-14 ENCOUNTER — Other Ambulatory Visit: Payer: Self-pay | Admitting: Internal Medicine

## 2014-01-14 ENCOUNTER — Encounter (INDEPENDENT_AMBULATORY_CARE_PROVIDER_SITE_OTHER): Payer: 59

## 2014-01-14 DIAGNOSIS — R209 Unspecified disturbances of skin sensation: Secondary | ICD-10-CM

## 2014-01-14 DIAGNOSIS — I10 Essential (primary) hypertension: Secondary | ICD-10-CM

## 2014-01-14 DIAGNOSIS — E782 Mixed hyperlipidemia: Secondary | ICD-10-CM

## 2014-01-14 DIAGNOSIS — R739 Hyperglycemia, unspecified: Secondary | ICD-10-CM

## 2014-01-14 LAB — BASIC METABOLIC PANEL
BUN: 14 mg/dL (ref 6–23)
CO2: 28 mEq/L (ref 19–32)
Calcium: 9.2 mg/dL (ref 8.4–10.5)
Chloride: 104 mEq/L (ref 96–112)
Creatinine, Ser: 0.8 mg/dL (ref 0.4–1.2)
GFR: 83.93 mL/min (ref >60.00)
GLUCOSE: 79 mg/dL (ref 70–99)
POTASSIUM: 4.5 meq/L (ref 3.5–5.1)
Sodium: 140 mEq/L (ref 135–145)

## 2014-01-14 LAB — HEPATIC FUNCTION PANEL
ALBUMIN: 4.4 g/dL (ref 3.5–5.2)
ALT: 22 U/L (ref 0–35)
AST: 18 U/L (ref 0–37)
Alkaline Phosphatase: 64 U/L (ref 39–117)
Bilirubin, Direct: 0 mg/dL (ref 0.0–0.3)
TOTAL PROTEIN: 7.3 g/dL (ref 6.0–8.3)
Total Bilirubin: 0.5 mg/dL (ref 0.2–1.2)

## 2014-01-14 LAB — TSH: TSH: 1.21 u[IU]/mL (ref 0.35–4.50)

## 2014-01-14 LAB — HEMOGLOBIN A1C: Hgb A1c MFr Bld: 5.8 % (ref 4.6–6.5)

## 2014-01-15 ENCOUNTER — Telehealth: Payer: Self-pay | Admitting: Internal Medicine

## 2014-01-15 NOTE — Telephone Encounter (Signed)
emmi emailed °

## 2014-01-16 LAB — NMR LIPOPROFILE WITH LIPIDS
Cholesterol, Total: 178 mg/dL (ref 100–199)
HDL Particle Number: 36.7 umol/L (ref >=30.5)
HDL Size: 8.9 nm — ABNORMAL LOW (ref >=9.2)
HDL-C: 61 mg/dL (ref >39)
LDL CALC: 104 mg/dL — AB (ref 0–99)
LDL PARTICLE NUMBER: 1378 nmol/L — AB (ref <1000)
LDL SIZE: 20.7 nm (ref >=20.8)
LP-IR SCORE: 41 (ref <=45)
Large HDL-P: 5.7 umol/L (ref >=4.8)
Large VLDL-P: 1.7 nmol/L (ref <=2.7)
SMALL LDL PARTICLE NUMBER: 486 nmol/L (ref <=527)
Triglycerides: 67 mg/dL (ref 0–149)
VLDL Size: 42.4 nm (ref <=46.6)

## 2014-02-03 ENCOUNTER — Other Ambulatory Visit: Payer: Self-pay

## 2014-02-03 MED ORDER — PRAVASTATIN SODIUM 20 MG PO TABS: ORAL_TABLET | ORAL | Status: DC

## 2014-02-27 ENCOUNTER — Encounter: Payer: Self-pay | Admitting: Internal Medicine

## 2014-02-27 ENCOUNTER — Other Ambulatory Visit: Payer: 59

## 2014-02-27 ENCOUNTER — Ambulatory Visit (INDEPENDENT_AMBULATORY_CARE_PROVIDER_SITE_OTHER): Payer: 59 | Admitting: Internal Medicine

## 2014-02-27 VITALS — BP 118/80 | HR 72 | Temp 99.0°F | Ht 58.25 in | Wt 138.0 lb

## 2014-02-27 DIAGNOSIS — M79662 Pain in left lower leg: Secondary | ICD-10-CM

## 2014-02-27 NOTE — Progress Notes (Signed)
   Subjective:    Patient ID: Brenda Gomez, female    DOB: 1961/02/16, 54 y.o.   MRN: [id number]  HPI  She has had pain in the left calf for over 3 weeks. There was no trigger or injury prior to this occurring  The pain is described as dull and cramping up to level III in the lower calf. The pain can last hours.  There is not a significant difference as to day or nighttime occurrence  She is concerned about a clot. She has a past medical history of CVA. She is on low-dose aspirin  She is not on a diuretic.  Review of Systems    She has no redness or swelling in the area of discomfort.  There is no change in the temperature or color of skin in this area  She denies fever, chills, or sweats  She has no chest pain, palpitations, exertional dyspnea, or paroxysmal nocturnal dyspnea.      Objective:   Physical Exam  Appears healthy and well-nourished & in no acute distress  No carotid bruits are present.No neck vein distention present at 10 - 15 degrees. Thyroid normal to palpation  Heart rhythm and rate are normal with no gallop or murmur  Chest is clear with no increased work of breathing  There is no evidence of aortic aneurysm or renal artery bruits  Abdomen soft with no organomegaly or masses. No HJR  No clubbing, cyanosis or edema present.  Pedal pulses are intact   Homan's negative bilaterally.  No Achilles tendon tenderness.  No ischemic skin changes are present . Fingernails healthy   Alert and oriented. Strength, tone normal  Gait normal. No calf pain with tip toe or heel walking      Assessment & Plan:  #1 calf pain Plan: D dimer See AVS

## 2014-02-27 NOTE — Patient Instructions (Signed)
Use an anti-inflammatory cream such as Aspercreme or Zostrix cream twice a day to the affected area as needed. In lieu of this warm moist compresses or  hot water bottle can be used. Do not apply ice   Please perform isometric exercises before going to bed & before arising. Sit on side of the bed and raise up on toes to a count of 5. Then put pressure on the heels to a count of 5. Repeat this process 10 times. This will improve blood flow to the calves & help prevent cramps.  Cal/Mag ( calcium/ magnesium) at bedtime as needed for leg discomfort.

## 2014-02-27 NOTE — Progress Notes (Signed)
Pre visit review using our clinic review tool, if applicable. No additional management support is needed unless otherwise documented below in the visit note. 

## 2014-02-28 LAB — D-DIMER, QUANTITATIVE: D-Dimer, Quant: 0.27 ug/mL-FEU (ref 0.00–0.48)

## 2014-04-30 ENCOUNTER — Ambulatory Visit (INDEPENDENT_AMBULATORY_CARE_PROVIDER_SITE_OTHER): Payer: 59 | Admitting: Internal Medicine

## 2014-04-30 ENCOUNTER — Encounter: Payer: Self-pay | Admitting: Internal Medicine

## 2014-04-30 VITALS — BP 128/82 | HR 82 | Temp 98.1°F | Resp 14 | Ht <= 58 in | Wt 136.5 lb

## 2014-04-30 DIAGNOSIS — R059 Cough, unspecified: Secondary | ICD-10-CM

## 2014-04-30 DIAGNOSIS — J3089 Other allergic rhinitis: Secondary | ICD-10-CM

## 2014-04-30 DIAGNOSIS — R05 Cough: Secondary | ICD-10-CM

## 2014-04-30 DIAGNOSIS — J0110 Acute frontal sinusitis, unspecified: Secondary | ICD-10-CM

## 2014-04-30 MED ORDER — BENZONATATE 200 MG PO CAPS: 200.0000 mg | ORAL_CAPSULE | Freq: Three times a day (TID) | ORAL | Status: DC | PRN

## 2014-04-30 MED ORDER — AMOXICILLIN 500 MG PO CAPS: 500.0000 mg | ORAL_CAPSULE | Freq: Three times a day (TID) | ORAL | Status: DC

## 2014-04-30 NOTE — Patient Instructions (Signed)

## 2014-04-30 NOTE — Progress Notes (Signed)
Pre visit review using our clinic review tool, if applicable. No additional management support is needed unless otherwise documented below in the visit note. 

## 2014-04-30 NOTE — Progress Notes (Signed)
   Subjective:    Patient ID: Brenda Gomez, female    DOB: [birth year], 54 y.o.   MRN: [id number]  HPI Symptoms began 04/25/14 as itchy, watery eyes. The cough was initially dry and was worse at night, waking the patient. NyQuil as of some benefit. She will also take Excedrin Migraine for associated frontal headache. Coricidin HP was also slightly beneficial.  She now  has associated symptoms of nasal congestion and nasal obstruction with post nasal drainage. She also describes anosmia.  There is still some itching in the ears. She has had some chills and sweats. She did not actually measure her temperature.  The cough has been associated with some shortness of breath and some chest wall pain with cough.  Review of Systems No sputum production,dyspnea, wheezing, facial pain, or otic discharge.     Objective:   Physical Exam Pertinent positive findings include: She has an upper dental plate.  The nasal septum is deviated to the right.  She has mild erythema of the nares without exudate.  General appearance:Adequately nourished; no acute distress or increased work of breathing is present.  No  lymphadenopathy about the head, neck, or axilla noted.  Eyes: No conjunctival inflammation or lid edema is present. There is no scleral icterus. Ears:  External ear exam shows no significant lesions or deformities.  Otoscopic examination reveals clear canals, tympanic membranes are intact bilaterally without bulging, retraction, inflammation or discharge. Nose:  External nasal examination shows no deformity or inflammation. No obstruction to airflow.  Oral exam: lips and gums are healthy appearing.There is no oropharyngeal erythema or exudate noted.  Neck:  No deformities, thyromegaly, masses, or tenderness noted.   Supple with full range of motion without pain. Heart:  Normal rate and regular rhythm. S1 and S2 normal without gallop, murmur, click, rub or other extra sounds. Lungs:Chest clear to  auscultation; no wheezes, rhonchi,rales ,or rubs present. Extremities:  No cyanosis, edema, or clubbing  noted  Skin: Warm & dry w/o jaundice or tenting.       Assessment & Plan:  #1 acute frontal sinusitis  #2 cough related to allergic rhinitis  Plan: See orders and recommendations

## 2014-06-20 NOTE — Telephone Encounter (Signed)
error:[id number] ° °

## 2014-06-30 ENCOUNTER — Ambulatory Visit (INDEPENDENT_AMBULATORY_CARE_PROVIDER_SITE_OTHER): Payer: 59 | Admitting: Internal Medicine

## 2014-06-30 ENCOUNTER — Encounter: Payer: Self-pay | Admitting: Internal Medicine

## 2014-06-30 VITALS — BP 118/80 | HR 93 | Temp 98.5°F | Resp 15 | Ht <= 58 in | Wt 135.8 lb

## 2014-06-30 DIAGNOSIS — M609 Myositis, unspecified: Secondary | ICD-10-CM

## 2014-06-30 DIAGNOSIS — J028 Acute pharyngitis due to other specified organisms: Secondary | ICD-10-CM | POA: Diagnosis not present

## 2014-06-30 DIAGNOSIS — M791 Myalgia: Secondary | ICD-10-CM

## 2014-06-30 DIAGNOSIS — R509 Fever, unspecified: Secondary | ICD-10-CM | POA: Diagnosis not present

## 2014-06-30 DIAGNOSIS — M2550 Pain in unspecified joint: Secondary | ICD-10-CM

## 2014-06-30 DIAGNOSIS — IMO0001 Reserved for inherently not codable concepts without codable children: Secondary | ICD-10-CM

## 2014-06-30 MED ORDER — AZITHROMYCIN 250 MG PO TABS: ORAL_TABLET | ORAL | Status: DC

## 2014-06-30 MED ORDER — PREDNISONE 20 MG PO TABS: 20.0000 mg | ORAL_TABLET | Freq: Two times a day (BID) | ORAL | Status: DC

## 2014-06-30 NOTE — Progress Notes (Signed)
Pre visit review using our clinic review tool, if applicable. No additional management support is needed unless otherwise documented below in the visit note. 

## 2014-06-30 NOTE — Patient Instructions (Addendum)
Plain Mucinex (NOT D) for thick secretions ;force NON dairy fluids .   Nasal cleansing in the shower as discussed with lather of mild shampoo.After 10 seconds wash off lather while  exhaling through nostrils. Make sure that all residual soap is removed to prevent irritation.  Flonase OR Nasacort AQ 1 spray in each nostril twice a day as needed. Use the "crossover" technique into opposite nostril spraying toward opposite ear @ 45 degree angle, not straight up into nostril.  Plain Allegra (NOT D )  160 daily , Loratidine 10 mg , OR Zyrtec 10 mg @ bedtime  as needed for itchy eyes & sneezing. Zicam Melts or Zinc lozenges as per package label for sore throat . Complementary options include  vitamin C 2000 mg daily; & Echinacea for 4-7 days. Report persistent or progressive fever; discolored nasal or chest secretions; or frontal headache or facial  pain.      NSAIDS ( Aleve, Advil, Naproxen) or Tylenol every 4 hrs as needed for fever as discussed based on label recommendations

## 2014-06-30 NOTE — Progress Notes (Signed)
   Subjective:    Patient ID: Brenda Gomez, female    DOB: 1960-02-26, 54 y.o.   MRN: [id number]  HPI The evening of 5/7-5/8 she began to experince chills, frontal headache, arthralgias, myalgias, fever to 100.8, and sore throat. She also described a nonproductive cough. She's been using Advil, Tylenol, and Alka-Seltzer plus with only partial response.  She did take the flu shot Review of Systems Facial pain , nasal purulence, dental pain,  otic pain or otic discharge denied. She denies any sputum production, wheezing, shortness of breath, or extrinsic symptoms of itchy, watery eyes or sneezing.  .    Objective:   Physical Exam  General appearance:Adequately nourished; no acute distress or increased work of breathing is present.    Lymphatic: No  lymphadenopathy about the head, neck, or axilla .  Eyes: No conjunctival inflammation or lid edema is present. There is no scleral icterus.  Ears:  External ear exam shows no significant lesions or deformities.  Otoscopic examination reveals clear canals, tympanic membranes are intact bilaterally without bulging, retraction, inflammation or discharge.  Nose:  External nasal examination shows no deformity or inflammation. Nasal mucosa are pink and moist without lesions or exudates No septal dislocation or deviation.No obstruction to airflow.   Oral exam: Upper plate; lips and gums are healthy appearing.There is severe oropharyngeal erythema without distinct exudate. Tonsils are 1.5+ enlarged .  Neck:  No deformities, thyromegaly, masses, or tenderness noted.   Supple with full range of motion without pain.   Heart:  Normal rate and regular rhythm. S1 and S2 normal without gallop, murmur, click, rub or other extra sounds.   Lungs:Chest clear to auscultation; no wheezes, rhonchi,rales ,or rubs present.  Extremities:  No cyanosis, edema, or clubbing  noted   Skin: Warm & dry w/o tenting or jaundice. No significant lesions or  rash.       Assessment & Plan:  #1 pharyngitis  #2 chills and fever.  #3 myalgias and arthralgias.  The history would suggest a viral etiology; but the oropharyngeal findings are dramatic. See after visit summary and orders

## 2014-08-11 ENCOUNTER — Other Ambulatory Visit: Payer: Self-pay | Admitting: Internal Medicine

## 2014-08-11 NOTE — Telephone Encounter (Signed)
Statin rx sent to pharm

## 2014-08-14 LAB — HM MAMMOGRAPHY

## 2014-08-19 ENCOUNTER — Encounter: Payer: Self-pay | Admitting: Emergency Medicine

## 2014-12-19 ENCOUNTER — Other Ambulatory Visit: Payer: Self-pay | Admitting: Internal Medicine

## 2014-12-25 ENCOUNTER — Other Ambulatory Visit: Payer: Self-pay | Admitting: Emergency Medicine

## 2014-12-25 ENCOUNTER — Telehealth: Payer: Self-pay | Admitting: Internal Medicine

## 2014-12-25 DIAGNOSIS — I10 Essential (primary) hypertension: Secondary | ICD-10-CM

## 2014-12-25 MED ORDER — LISINOPRIL 20 MG PO TABS: ORAL_TABLET | ORAL | Status: DC

## 2014-12-25 NOTE — Telephone Encounter (Signed)
Pt request refill for lisinopril (PRINIVIL,ZESTRIL) 20 MG tablet to be send to [facility]. Please help

## 2015-02-21 ENCOUNTER — Other Ambulatory Visit: Payer: Self-pay | Admitting: Internal Medicine

## 2015-02-27 ENCOUNTER — Telehealth: Payer: Self-pay | Admitting: Internal Medicine

## 2015-02-27 DIAGNOSIS — I10 Essential (primary) hypertension: Secondary | ICD-10-CM

## 2015-02-27 NOTE — Telephone Encounter (Signed)
Patient is establishing on feb 9. Patient needs refill of lisinopril (PRINIVIL,ZESTRIL) 20 MG tablet [id number] and diltiazem (CARDIZEM) 90 MG tablet [id number] to pharmacy on file

## 2015-03-01 ENCOUNTER — Other Ambulatory Visit: Payer: Self-pay | Admitting: Internal Medicine

## 2015-03-02 ENCOUNTER — Other Ambulatory Visit: Payer: Self-pay | Admitting: Geriatric Medicine

## 2015-03-02 DIAGNOSIS — I10 Essential (primary) hypertension: Secondary | ICD-10-CM

## 2015-03-02 MED ORDER — DILTIAZEM HCL 90 MG PO TABS: 90.0000 mg | ORAL_TABLET | Freq: Two times a day (BID) | ORAL | Status: DC

## 2015-03-02 MED ORDER — LISINOPRIL 20 MG PO TABS: ORAL_TABLET | ORAL | Status: DC

## 2015-03-02 NOTE — Telephone Encounter (Signed)
These have been sent to [facility]

## 2015-03-02 NOTE — Telephone Encounter (Signed)
Pt is nervous about not having this med, taylor can you call her today

## 2015-03-31 ENCOUNTER — Ambulatory Visit (INDEPENDENT_AMBULATORY_CARE_PROVIDER_SITE_OTHER): Payer: 59 | Admitting: Adult Health

## 2015-03-31 ENCOUNTER — Encounter: Payer: Self-pay | Admitting: Adult Health

## 2015-03-31 DIAGNOSIS — B9789 Other viral agents as the cause of diseases classified elsewhere: Secondary | ICD-10-CM

## 2015-03-31 DIAGNOSIS — J329 Chronic sinusitis, unspecified: Secondary | ICD-10-CM

## 2015-03-31 DIAGNOSIS — B349 Viral infection, unspecified: Secondary | ICD-10-CM | POA: Diagnosis not present

## 2015-03-31 MED ORDER — PREDNISONE 20 MG PO TABS: 20.0000 mg | ORAL_TABLET | Freq: Two times a day (BID) | ORAL | Status: DC

## 2015-03-31 NOTE — Patient Instructions (Addendum)
It was great meeting you today! Your exam is consistent with an viral upper respiratory infection.   I have sent in a prescription for prednisone. Take this as directed.   Also use a Flonase or Nasocort. Afrin can be used but for no longer than 3 days.   Follow up if no improvement in the next 2-3 days.   Upper Respiratory Infection, Adult Most upper respiratory infections (URIs) are a viral infection of the air passages leading to the lungs. A URI affects the nose, throat, and upper air passages. The most common type of URI is nasopharyngitis and is typically referred to as "the common cold." URIs run their course and usually go away on their own. Most of the time, a URI does not require medical attention, but sometimes a bacterial infection in the upper airways can follow a viral infection. This is called a secondary infection. Sinus and middle ear infections are common types of secondary upper respiratory infections. Bacterial pneumonia can also complicate a URI. A URI can worsen asthma and chronic obstructive pulmonary disease (COPD). Sometimes, these complications can require emergency medical care and may be life threatening.  CAUSES Almost all URIs are caused by viruses. A virus is a type of germ and can spread from one person to another.  RISKS FACTORS You may be at risk for a URI if:   You smoke.   You have chronic heart or lung disease.  You have a weakened defense (immune) system.   You are very young or very old.   You have nasal allergies or asthma.  You work in crowded or poorly ventilated areas.  You work in health care facilities or schools. SIGNS AND SYMPTOMS  Symptoms typically develop 2-3 days after you come in contact with a cold virus. Most viral URIs last 7-10 days. However, viral URIs from the influenza virus (flu virus) can last 14-18 days and are typically more severe. Symptoms may include:   Runny or stuffy (congested) nose.   Sneezing.   Cough.    Sore throat.   Headache.   Fatigue.   Fever.   Loss of appetite.   Pain in your forehead, behind your eyes, and over your cheekbones (sinus pain).  Muscle aches.  DIAGNOSIS  Your health care provider may diagnose a URI by:  Physical exam.  Tests to check that your symptoms are not due to another condition such as:  Strep throat.  Sinusitis.  Pneumonia.  Asthma. TREATMENT  A URI goes away on its own with time. It cannot be cured with medicines, but medicines may be prescribed or recommended to relieve symptoms. Medicines may help:  Reduce your fever.  Reduce your cough.  Relieve nasal congestion. HOME CARE INSTRUCTIONS   Take medicines only as directed by your health care provider.   Gargle warm saltwater or take cough drops to comfort your throat as directed by your health care provider.  Use a warm mist humidifier or inhale steam from a shower to increase air moisture. This may make it easier to breathe.  Drink enough fluid to keep your urine clear or pale yellow.   Eat soups and other clear broths and maintain good nutrition.   Rest as needed.   Return to work when your temperature has returned to normal or as your health care provider advises. You may need to stay home longer to avoid infecting others. You can also use a face mask and careful hand washing to prevent spread of the virus.  Increase the usage of your inhaler if you have asthma.   Do not use any tobacco products, including cigarettes, chewing tobacco, or electronic cigarettes. If you need help quitting, ask your health care provider. PREVENTION  The best way to protect yourself from getting a cold is to practice good hygiene.   Avoid oral or hand contact with people with cold symptoms.   Wash your hands often if contact occurs.  There is no clear evidence that vitamin C, vitamin E, echinacea, or exercise reduces the chance of developing a cold. However, it is always  recommended to get plenty of rest, exercise, and practice good nutrition.  SEEK MEDICAL CARE IF:   You are getting worse rather than better.   Your symptoms are not controlled by medicine.   You have chills.  You have worsening shortness of breath.  You have brown or red mucus.  You have yellow or brown nasal discharge.  You have pain in your face, especially when you bend forward.  You have a fever.  You have swollen neck glands.  You have pain while swallowing.  You have white areas in the back of your throat. SEEK IMMEDIATE MEDICAL CARE IF:   You have severe or persistent:  Headache.  Ear pain.  Sinus pain.  Chest pain.  You have chronic lung disease and any of the following:  Wheezing.  Prolonged cough.  Coughing up blood.  A change in your usual mucus.  You have a stiff neck.  You have changes in your:  Vision.  Hearing.  Thinking.  Mood. MAKE SURE YOU:   Understand these instructions.  Will watch your condition.  Will get help right away if you are not doing well or get worse.   This information is not intended to replace advice given to you by your health care provider. Make sure you discuss any questions you have with your health care provider.   Document Released: 08/03/2000 Document Revised: 06/24/2014 Document Reviewed: 05/15/2013 Elsevier Interactive Patient Education [employer].

## 2015-03-31 NOTE — Progress Notes (Signed)
Pre visit review using our clinic review tool, if applicable. No additional management support is needed unless otherwise documented below in the visit note. 

## 2015-03-31 NOTE — Progress Notes (Signed)
Subjective:    Patient ID: Brenda Gomez, female    DOB: 1961-01-31, 55 y.o.   MRN: [id number]  HPI  55 year old female, patient of Dr .Linna Darner who presents to the office for an acute complaints. Her symptoms are a non productive cough, sinus congestion, sinus pain and pressure and a sore throat.   She has been taking Mucinex and OTC sinus medication which did not help.   Review of Systems  Constitutional: Positive for fatigue. Negative for diaphoresis.  HENT: Positive for congestion, rhinorrhea, sinus pressure and sore throat. Negative for ear discharge, ear pain, postnasal drip and trouble swallowing.   Skin: Negative.   Neurological: Positive for headaches. Negative for weakness and light-headedness.  Psychiatric/Behavioral: Positive for sleep disturbance.  All other systems reviewed and are negative.  Past Medical History  Diagnosis Date  . Hypertension   . History of cerebral embolism with infarction-  3 yrs ago,  age 48    residual left hand occasional numb/ tingle-- NEUROLOGIST  DR WILLIS-- LAST NOTE 02-25-2009 W/ CHART  . History of kidney stones     Dr Junious Silk  . Hand tingling left hand , occasional  . Renal cyst left  . Allergic rhinitis   . History of cerebrovascular accident with residual effects   . PFO (patent foramen ovale) small- per Dr  Jannifer Franklin, positive transcranial bubble study, and tee echo  . Dyslipidemia   . [id number]     Dr Jannifer Franklin Rxed Nortriptyline    Social History   Social History  . Marital Status: Married    Spouse Name: N/A  . Number of Children: N/A  . Years of Education: N/A   Occupational History  . Not on file.   Social History Main Topics  . Smoking status: Former Smoker -- 0.25 packs/day for 20 years    Types: Cigarettes    Quit date: 03/22/2010  . Smokeless tobacco: Never Used     Comment: smoked 1980- 2012, up to 1 pp WEEK  . Alcohol Use: No  . Drug Use: No  . Sexual Activity: Not on file   Other Topics Concern  .  Not on file   Social History Narrative    Past Surgical History  Procedure Laterality Date  . Nephrolithotomy  1998    LEFT KIDNEY STONE  . Extracorporeal shock wave lithotripsy  1986 X2  , 2013    LEFT X 2; R X 1. Dr Junious Silk, [facility]  . Appendectomy  2003  . Cardiovascular stress test  2004    NORMAL  . Transcranial doppler  11-20-2008    POSITIVE BUBBLE STUDY - SMALL INTRACARDIAC RIGHT TO LEFT SHUNT  . Transesophageal echocardiogram  01-19-2009    SMALL PFO/ NO SIGNIFICANT VALVULAR DISEASE/ EF 60-65%  . Cystoscopy w/ retrogrades  03/24/2011    Procedure: CYSTOSCOPY WITH RETROGRADE PYELOGRAM;  Surgeon: Fredricka Bonine, MD;  Location: [facility];  Service: Urology;  Laterality: Right;  RT RPG, RT URETERAL STENT, BLADDER BIOPSY AND FULGERATION   . Wisdom tooth extraction      Family History  Problem Relation Age of Onset  . Diabetes Mother   . Stroke Mother 68  . Nephrolithiasis Mother   . Cancer Sister 8    sarcoma  . Diabetes Maternal Aunt   . Diabetes Maternal Grandmother   . Heart attack Maternal Grandmother     > 65  . Cancer Maternal Grandfather     melanoma with pulmonary mets  Allergies  Allergen Reactions  . Codeine Swelling    Facial edema Because of a history of documented adverse serious drug reaction;Medi Alert bracelet  is recommended    Current Outpatient Prescriptions on File Prior to Visit  Medication Sig Dispense Refill  . aspirin 81 MG tablet Take 1 tablet (81 mg total) by mouth daily. 30 tablet   . Calcium Carbonate (CALTRATE 600 PO) Take by mouth.    . cetirizine (ZYRTEC) 10 MG tablet Take 10 mg by mouth every evening.    . diltiazem (CARDIZEM) 90 MG tablet Take 1 tablet (90 mg total) by mouth 2 (two) times daily. 180 tablet 3  . fish oil-omega-3 fatty acids 1000 MG capsule Take 1 g by mouth 2 (two) times daily.     Marland Kitchen ibuprofen (ADVIL,MOTRIN) 200 MG tablet Take 1 tablet (200 mg total) by mouth every 6 (six)  hours as needed. 30 tablet 0  . lisinopril (PRINIVIL,ZESTRIL) 20 MG tablet TAKE ONE TABLET BY MOUTH TWICE DAILY 180 tablet 3  . Multiple Vitamin (MULITIVITAMIN WITH MINERALS) TABS Take 1 tablet by mouth daily.    . pravastatin (PRAVACHOL) 20 MG tablet TAKE ONE TABLET BY MOUTH AT BEDTIME 90 tablet 2   No current facility-administered medications on file prior to visit.    BP 110/82 mmHg  Pulse 70  Temp(Src) 98.3 F (36.8 C) (Oral)  Ht 4\' 10"  (1.473 m)  Wt 136 lb 1.6 oz (61.735 kg)  BMI 28.45 kg/m2  SpO2 96%       Objective:   Physical Exam  Constitutional: She is oriented to person, place, and time. She appears well-developed and well-nourished. No distress.  HENT:  Head: Normocephalic and atraumatic.  Right Ear: External ear normal.  Left Ear: External ear normal.  Nose: Nose normal.  Mouth/Throat: Oropharynx is clear and moist. No oropharyngeal exudate.  Edema in bilateral nare. No signs of strep  Neck: Normal range of motion. Neck supple.  Cardiovascular: Normal rate, regular rhythm, normal heart sounds and intact distal pulses.  Exam reveals no gallop and no friction rub.   No murmur heard. Pulmonary/Chest: Effort normal and breath sounds normal. No respiratory distress. She has no wheezes. She has no rales. She exhibits no tenderness.  Musculoskeletal: Normal range of motion.  Lymphadenopathy:    She has no cervical adenopathy.  Neurological: She is alert and oriented to person, place, and time.  Skin: Skin is warm and dry. No rash noted. She is not diaphoretic. No erythema. No pallor.  Psychiatric: She has a normal mood and affect. Her behavior is normal. Judgment and thought content normal.  Nursing note and vitals reviewed.     Assessment & Plan:   1. Viral sinusitis - predniSONE (DELTASONE) 20 MG tablet; Take 1 tablet (20 mg total) by mouth 2 (two) times daily.  Dispense: 10 tablet; Refill: 1 - Flonase or Nasocort - Benadryl at night to help her sleep.  -  Follow up if no improvement.

## 2015-04-02 ENCOUNTER — Encounter: Payer: Self-pay | Admitting: Internal Medicine

## 2015-04-02 ENCOUNTER — Ambulatory Visit (INDEPENDENT_AMBULATORY_CARE_PROVIDER_SITE_OTHER): Payer: 59 | Admitting: Internal Medicine

## 2015-04-02 VITALS — BP 126/84 | HR 80 | Temp 98.2°F | Resp 16 | Wt 135.0 lb

## 2015-04-02 DIAGNOSIS — Z8673 Personal history of transient ischemic attack (TIA), and cerebral infarction without residual deficits: Secondary | ICD-10-CM | POA: Diagnosis not present

## 2015-04-02 DIAGNOSIS — I10 Essential (primary) hypertension: Secondary | ICD-10-CM

## 2015-04-02 DIAGNOSIS — E782 Mixed hyperlipidemia: Secondary | ICD-10-CM | POA: Diagnosis not present

## 2015-04-02 DIAGNOSIS — D170 Benign lipomatous neoplasm of skin and subcutaneous tissue of head, face and neck: Secondary | ICD-10-CM

## 2015-04-02 DIAGNOSIS — Z1211 Encounter for screening for malignant neoplasm of colon: Secondary | ICD-10-CM

## 2015-04-02 DIAGNOSIS — Z139 Encounter for screening, unspecified: Secondary | ICD-10-CM

## 2015-04-02 DIAGNOSIS — J0110 Acute frontal sinusitis, unspecified: Secondary | ICD-10-CM

## 2015-04-02 MED ORDER — LISINOPRIL 20 MG PO TABS
ORAL_TABLET | ORAL | Status: DC
Start: 2015-04-02 — End: 2016-04-07

## 2015-04-02 MED ORDER — DILTIAZEM HCL 90 MG PO TABS: 90.0000 mg | ORAL_TABLET | Freq: Two times a day (BID) | ORAL | Status: DC

## 2015-04-02 MED ORDER — PRAVASTATIN SODIUM 20 MG PO TABS: 20.0000 mg | ORAL_TABLET | Freq: Every day | ORAL | Status: DC

## 2015-04-02 NOTE — Patient Instructions (Addendum)
  We have reviewed your prior records including labs and tests today.  Test(s) ordered today. Your results will be released to [place] (or called to you) after review, usually within 72hours after test completion. If any changes need to be made, you will be notified at that same time.  All other Health Maintenance issues reviewed.   All recommended immunizations and age-appropriate screenings are up-to-date.  No immunizations administered today.   Medications reviewed and updated.  No changes recommended at this time.  Your prescription(s) have been submitted to your pharmacy. Please take as directed and contact our office if you believe you are having problem(s) with the medication(s).  A referral was ordered for ENT and GI for a colonoscopy.   Please followup annually

## 2015-04-02 NOTE — Progress Notes (Signed)
Subjective:    Patient ID: Brenda Gomez, female    DOB: 1960-09-30, 55 y.o.   MRN: [id number]  HPI She is here to establish with a new pcp.   Swelling anterior to left ear:  She noticed in a couple of months ago.  It is sore and has gotten larger.   URI:  Her symptoms started 5 days ago.  She went to [place] two days ago.  She was placed on prednisone.  She was given a zpak in the past and wonders if she needs that as well.  She has a cough, sinus pressure in frontal area.   Hypertension: She is taking her medication daily. She is compliant with a low sodium diet.  She denies chest pain, palpitations, edema, shortness of breath and regular headaches. She is exercising regularly.  She does not monitor her blood pressure at home.    Hyperlipidemia: She is taking her medication daily. She is compliant with a low fat/cholesterol diet. She is exercising regularly. She denies myalgias.   History of a CVA:  She had that due to a PFO.  She has occasional numbnes in her left hand when she is tired, but denies any other residuals.  Medications and allergies reviewed with patient and updated if appropriate.  Patient Active Problem List   Diagnosis Date Noted  . History of CVA (cerebrovascular accident) 12/02/2008  . PARESTHESIA 08/20/2008  . Hyperglycemia 08/20/2008  . Essential hypertension 08/16/2007  . NEPHROLITHIASIS, HX OF 08/16/2007  . HYPERLIPIDEMIA 04/06/2007    Current Outpatient Prescriptions on File Prior to Visit  Medication Sig Dispense Refill  . aspirin 81 MG tablet Take 1 tablet (81 mg total) by mouth daily. 30 tablet   . Calcium Carbonate (CALTRATE 600 PO) Take by mouth.    . cetirizine (ZYRTEC) 10 MG tablet Take 10 mg by mouth every evening.    . diltiazem (CARDIZEM) 90 MG tablet Take 1 tablet (90 mg total) by mouth 2 (two) times daily. 180 tablet 3  . fish oil-omega-3 fatty acids 1000 MG capsule Take 1 g by mouth 2 (two) times daily.     Marland Kitchen lisinopril  (PRINIVIL,ZESTRIL) 20 MG tablet TAKE ONE TABLET BY MOUTH TWICE DAILY 180 tablet 3  . Multiple Vitamin (MULITIVITAMIN WITH MINERALS) TABS Take 1 tablet by mouth daily.    . pravastatin (PRAVACHOL) 20 MG tablet TAKE ONE TABLET BY MOUTH AT BEDTIME 90 tablet 2  . predniSONE (DELTASONE) 20 MG tablet Take 1 tablet (20 mg total) by mouth 2 (two) times daily. 10 tablet 1   No current facility-administered medications on file prior to visit.    Past Medical History  Diagnosis Date  . Hypertension   . History of cerebral embolism with infarction-  3 yrs ago,  age 47    residual left hand occasional numb/ tingle-- NEUROLOGIST  DR WILLIS-- LAST NOTE 02-25-2009 W/ CHART  . History of kidney stones     Dr Junious Silk  . Hand tingling left hand , occasional  . Renal cyst left  . Allergic rhinitis   . History of cerebrovascular accident with residual effects   . PFO (patent foramen ovale) small- per Dr  Jannifer Franklin, positive transcranial bubble study, and tee echo  . Dyslipidemia   . [id number]     Dr Jannifer Franklin Rxed Nortriptyline    Past Surgical History  Procedure Laterality Date  . Nephrolithotomy  1998    LEFT KIDNEY STONE  . Extracorporeal shock wave lithotripsy  1986 X2  ,  2013    LEFT X 2; R X 1. Dr Junious Silk, [facility]  . Appendectomy  2003  . Cardiovascular stress test  2004    NORMAL  . Transcranial doppler  11-20-2008    POSITIVE BUBBLE STUDY - SMALL INTRACARDIAC RIGHT TO LEFT SHUNT  . Transesophageal echocardiogram  01-19-2009    SMALL PFO/ NO SIGNIFICANT VALVULAR DISEASE/ EF 60-65%  . Cystoscopy w/ retrogrades  03/24/2011    Procedure: CYSTOSCOPY WITH RETROGRADE PYELOGRAM;  Surgeon: Fredricka Bonine, MD;  Location: [facility];  Service: Urology;  Laterality: Right;  RT RPG, RT URETERAL STENT, BLADDER BIOPSY AND FULGERATION   . Wisdom tooth extraction      Social History   Social History  . Marital Status: Married    Spouse Name: N/A  . Number of  Children: N/A  . Years of Education: N/A   Social History Main Topics  . Smoking status: Former Smoker -- 0.25 packs/day for 20 years    Types: Cigarettes    Quit date: 03/22/2010  . Smokeless tobacco: Never Used     Comment: smoked 1980- 2012, up to 1 pp WEEK  . Alcohol Use: No  . Drug Use: No  . Sexual Activity: Not Asked   Other Topics Concern  . None   Social History Narrative    Family History  Problem Relation Age of Onset  . Diabetes Mother   . Stroke Mother 21  . Nephrolithiasis Mother   . Cancer Sister 8    sarcoma  . Diabetes Maternal Aunt   . Diabetes Maternal Grandmother   . Heart attack Maternal Grandmother     > 65  . Cancer Maternal Grandfather     melanoma with pulmonary mets    Review of Systems  Constitutional: Negative for fever and chills.  HENT: Positive for congestion, postnasal drip, sinus pressure and sore throat (intermittent irritation). Negative for ear pain.   Respiratory: Positive for cough (related PND). Negative for shortness of breath and wheezing.   Cardiovascular: Negative for chest pain, palpitations and leg swelling.  Gastrointestinal: Negative for nausea, diarrhea and anal bleeding.  Neurological: Positive for headaches (sinus/allergy, tension). Negative for dizziness and light-headedness.       Objective:   Filed Vitals:   04/02/15 0910  BP: 126/84  Pulse: 80  Temp: 98.2 F (36.8 C)  Resp: 16   Filed Weights   04/02/15 0910  Weight: 135 lb (61.236 kg)   Body mass index is 28.22 kg/(m^2).   Physical Exam GENERAL APPEARANCE: Appears stated age, well appearing, NAD EYES: conjunctiva clear, no icterus HEENT: bilateral tympanic membranes and ear canals normal, oropharynx with no erythema, no thyromegaly, trachea midline, lipoma like lesion - soft, and mobile anterior to left ear.  no cervical or supraclavicular lymphadenopathy LUNGS: Clear to auscultation without wheeze or crackles, unlabored breathing, good air entry  bilaterally HEART: Normal S1,S2 without murmurs EXTREMITIES: Without clubbing, cyanosis, or edema      Assessment & Plan:   Sinus infection Her symptoms have improved with steroids, but she still has symptoms Likely viral No need for an antibiotic Continue symptomatic treatment  Lipoma anterior to left ear The lump feels like a lipoma Will refer to ENT    See Problem List for Assessment and Plan of chronic medical problems.  Follow up annually

## 2015-04-02 NOTE — Progress Notes (Signed)
Pre visit review using our clinic review tool, if applicable. No additional management support is needed unless otherwise documented below in the visit note. 

## 2015-04-02 NOTE — Assessment & Plan Note (Signed)
Mild numbness in left hand on occasion when tired Taking asa 81 daily BP controlled On statin

## 2015-04-04 NOTE — Assessment & Plan Note (Signed)
Taking pravastatin 20 mg daily

## 2015-04-04 NOTE — Assessment & Plan Note (Signed)
BP Readings from Last 3 Encounters:  04/02/15 126/84  03/31/15 110/82  06/30/14 118/80   BP well controlled Continue current meds

## 2015-05-21 ENCOUNTER — Ambulatory Visit (AMBULATORY_SURGERY_CENTER): Payer: Self-pay | Admitting: *Deleted

## 2015-05-21 VITALS — Ht <= 58 in | Wt 142.2 lb

## 2015-05-21 DIAGNOSIS — Z1211 Encounter for screening for malignant neoplasm of colon: Secondary | ICD-10-CM

## 2015-05-21 MED ORDER — NA SULFATE-K SULFATE-MG SULF 17.5-3.13-1.6 GM/177ML PO SOLN: 1.0000 | Freq: Once | ORAL | Status: DC

## 2015-05-21 NOTE — Progress Notes (Signed)
No egg or soy allergy known to patient  No issues with past sedation with any surgeries  or procedures, no intubation problems  No diet pills per patient No home 02 use per patient  No blood thinners per patient  Pt denies issues with constipation  emmi video to  [email or web address]

## 2015-05-27 ENCOUNTER — Other Ambulatory Visit (INDEPENDENT_AMBULATORY_CARE_PROVIDER_SITE_OTHER): Payer: 59

## 2015-05-27 ENCOUNTER — Encounter: Payer: Self-pay | Admitting: Internal Medicine

## 2015-05-27 DIAGNOSIS — Z8673 Personal history of transient ischemic attack (TIA), and cerebral infarction without residual deficits: Secondary | ICD-10-CM

## 2015-05-27 DIAGNOSIS — I10 Essential (primary) hypertension: Secondary | ICD-10-CM

## 2015-05-27 DIAGNOSIS — E782 Mixed hyperlipidemia: Secondary | ICD-10-CM | POA: Diagnosis not present

## 2015-05-27 DIAGNOSIS — Z139 Encounter for screening, unspecified: Secondary | ICD-10-CM

## 2015-05-27 LAB — CBC WITH DIFFERENTIAL/PLATELET
BASOS PCT: 0.7 % (ref 0.0–3.0)
Basophils Absolute: 0 10*3/uL (ref 0.0–0.1)
EOS PCT: 2.8 % (ref 0.0–5.0)
Eosinophils Absolute: 0.2 10*3/uL (ref 0.0–0.7)
HCT: 43.5 % (ref 36.0–46.0)
Hemoglobin: 14.7 g/dL (ref 12.0–15.0)
LYMPHS ABS: 2.2 10*3/uL (ref 0.7–4.0)
Lymphocytes Relative: 34.6 % (ref 12.0–46.0)
MCHC: 33.9 g/dL (ref 30.0–36.0)
MCV: 87.8 fl (ref 78.0–100.0)
MONO ABS: 0.4 10*3/uL (ref 0.1–1.0)
Monocytes Relative: 6.8 % (ref 3.0–12.0)
Neutro Abs: 3.4 10*3/uL (ref 1.4–7.7)
Neutrophils Relative %: 55.1 % (ref 43.0–77.0)
PLATELETS: 254 10*3/uL (ref 150.0–400.0)
RBC: 4.95 Mil/uL (ref 3.87–5.11)
RDW: 12.7 % (ref 11.5–15.5)
WBC: 6.2 10*3/uL (ref 4.0–10.5)

## 2015-05-27 LAB — COMPREHENSIVE METABOLIC PANEL
ALBUMIN: 4.6 g/dL (ref 3.5–5.2)
ALT: 16 U/L (ref 0–35)
AST: 14 U/L (ref 0–37)
Alkaline Phosphatase: 62 U/L (ref 39–117)
BUN: 19 mg/dL (ref 6–23)
CALCIUM: 9.7 mg/dL (ref 8.4–10.5)
CHLORIDE: 104 meq/L (ref 96–112)
CO2: 27 meq/L (ref 19–32)
Creatinine, Ser: 0.81 mg/dL (ref 0.40–1.20)
GFR: 78.75 mL/min (ref >60.00)
Glucose, Bld: 93 mg/dL (ref 70–99)
POTASSIUM: 4.3 meq/L (ref 3.5–5.1)
Sodium: 140 mEq/L (ref 135–145)
Total Bilirubin: 0.6 mg/dL (ref 0.2–1.2)
Total Protein: 7.2 g/dL (ref 6.0–8.3)

## 2015-05-27 LAB — LIPID PANEL
CHOLESTEROL: 184 mg/dL (ref 0–200)
HDL: 55.9 mg/dL (ref >39.00)
LDL CALC: 113 mg/dL — AB (ref 0–99)
NonHDL: 128.24
TRIGLYCERIDES: 76 mg/dL (ref 0.0–149.0)
Total CHOL/HDL Ratio: 3
VLDL: 15.2 mg/dL (ref 0.0–40.0)

## 2015-05-27 LAB — HEPATITIS C ANTIBODY: HCV Ab: NEGATIVE

## 2015-05-27 LAB — HEMOGLOBIN A1C: HEMOGLOBIN A1C: 5.8 % (ref 4.6–6.5)

## 2015-05-27 LAB — TSH: TSH: 1.03 u[IU]/mL (ref 0.35–4.50)

## 2015-05-27 LAB — HIV ANTIBODY (ROUTINE TESTING W REFLEX): HIV 1&2 Ab, 4th Generation: NONREACTIVE

## 2015-06-04 ENCOUNTER — Encounter: Payer: Self-pay | Admitting: Gastroenterology

## 2015-06-04 ENCOUNTER — Ambulatory Visit (AMBULATORY_SURGERY_CENTER): Payer: 59 | Admitting: Gastroenterology

## 2015-06-04 VITALS — BP 112/88 | HR 70 | Temp 99.1°F | Resp 20 | Ht <= 58 in | Wt 142.0 lb

## 2015-06-04 DIAGNOSIS — Z1211 Encounter for screening for malignant neoplasm of colon: Secondary | ICD-10-CM

## 2015-06-04 MED ORDER — SODIUM CHLORIDE 0.9 % IV SOLN: 500.0000 mL | INTRAVENOUS | Status: DC

## 2015-06-04 NOTE — Progress Notes (Signed)
To pacu vss patent aw report to rn 

## 2015-06-04 NOTE — Patient Instructions (Addendum)
YOU HAD AN ENDOSCOPIC PROCEDURE TODAY AT [facility]:   Refer to the procedure report that was given to you for any specific questions about what was found during the examination.  If the procedure report does not answer your questions, please call your gastroenterologist to clarify.  If you requested that your care partner not be given the details of your procedure findings, then the procedure report has been included in a sealed envelope for you to review at your convenience later.  YOU SHOULD EXPECT: Some feelings of bloating in the abdomen. Passage of more gas than usual.  Walking can help get rid of the air that was put into your GI tract during the procedure and reduce the bloating. If you had a lower endoscopy (such as a colonoscopy or flexible sigmoidoscopy) you may notice spotting of blood in your stool or on the toilet paper. If you underwent a bowel prep for your procedure, you may not have a normal bowel movement for a few days.  Please Note:  You might notice some irritation and congestion in your nose or some drainage.  This is from the oxygen used during your procedure.  There is no need for concern and it should clear up in a day or so.  SYMPTOMS TO REPORT IMMEDIATELY:   Following lower endoscopy (colonoscopy or flexible sigmoidoscopy):  Excessive amounts of blood in the stool  Significant tenderness or worsening of abdominal pains  Swelling of the abdomen that is new, acute  Fever of 100F or higher    For urgent or emergent issues, a gastroenterologist can be reached at any hour by calling [phone].   DIET: Your first meal following the procedure should be a small meal and then it is ok to progress to your normal diet. Heavy or fried foods are harder to digest and may make you feel nauseous or bloated.  Likewise, meals heavy in dairy and vegetables can increase bloating.  Drink plenty of fluids but you should avoid alcoholic beverages for 24  hours.  ACTIVITY:  You should plan to take it easy for the rest of today and you should NOT DRIVE or use heavy machinery until tomorrow (because of the sedation medicines used during the test).    FOLLOW UP: Our staff will call the number listed on your records the next business day following your procedure to check on you and address any questions or concerns that you may have regarding the information given to you following your procedure. If we do not reach you, we will leave a message.  However, if you are feeling well and you are not experiencing any problems, there is no need to return our call.  We will assume that you have returned to your regular daily activities without incident.  If any biopsies were taken you will be contacted by phone or by letter within the next 1-3 weeks.  Please call us at [phone] if you have not heard about the biopsies in 3 weeks.    SIGNATURES/CONFIDENTIALITY: You and/or your care partner have signed paperwork which will be entered into your electronic medical record.  These signatures attest to the fact that that the information above on your After Visit Summary has been reviewed and is understood.  Full responsibility of the confidentiality of this discharge information lies with you and/or your care-partner.  Diverticulosis, high fiber diet, hemorrhoids, banding-handouts given.  Repeat colonoscopy in 10 years 2027.

## 2015-06-04 NOTE — Op Note (Signed)
[place] Patient Name: Brenda Gomez Procedure Date: 06/04/2015 9:39 AM MRN: [id number] Endoscopist: Mauri Pole , MD Age: 55 Date of Birth: 07/11/1960 Gender: Female Procedure:                Colonoscopy Indications:              Screening for colorectal malignant neoplasm Medicines:                Monitored Anesthesia Care Procedure:                Pre-Anesthesia Assessment:                           - Prior to the procedure, a History and Physical                            was performed, and patient medications and                            allergies were reviewed. The patient's tolerance of                            previous anesthesia was also reviewed. The risks                            and benefits of the procedure and the sedation                            options and risks were discussed with the patient.                            All questions were answered, and informed consent                            was obtained. Anticoagulants: The patient has taken                            aspirin. It was decided not to withhold this                            medication prior to procedure. ASA Grade                            Assessment: III - A patient with severe systemic                            disease. After reviewing the risks and benefits,                            the patient was deemed in satisfactory condition to                            undergo the procedure.  After obtaining informed consent, the colonoscope                            was passed under direct vision. Throughout the                            procedure, the patient's blood pressure, pulse, and                            oxygen saturations were monitored continuously. The                            Model CF-HQ190L [id number]) scope was introduced                            through the anus and advanced to the the terminal     ileum, with identification of the appendiceal                            orifice and IC valve. The colonoscopy was performed                            without difficulty. The patient tolerated the                            procedure well. The quality of the bowel                            preparation was good. The terminal ileum, ileocecal                            valve, appendiceal orifice, and rectum were                            photographed. Scope In: 9:59:37 AM Scope Out: 10:12:06 AM Scope Withdrawal Time: 0 hours 8 minutes 23 seconds  Total Procedure Duration: 0 hours 12 minutes 29 seconds  Findings:                 The perianal and digital rectal examinations were                            normal.                           Multiple small and large-mouthed diverticula were                            found in the sigmoid colon and descending colon.                            There was no evidence of diverticular bleeding.                           Non-bleeding internal hemorrhoids were found during  retroflexion. The hemorrhoids were medium-sized.                           The exam was otherwise without abnormality. Complications:            No immediate complications. Estimated Blood Loss:     Estimated blood loss: none. Impression:               - Non-bleeding internal hemorrhoids.                           - Mild diverticulosis in the sigmoid colon and in                            the descending colon. There was no evidence of                            diverticular bleeding.                           - No specimens collected. Recommendation:           - Patient has a contact number available for                            emergencies. The signs and symptoms of potential                            delayed complications were discussed with the                            patient. Return to normal activities tomorrow.                             Written discharge instructions were provided to the                            patient.                           - Resume previous diet.                           - Continue present medications.                           - Repeat colonoscopy in 10 years for screening                            purposes.                           - Return to GI clinic PRN. Mauri Pole, MD 06/04/2015 10:17:12 AM This report has been signed electronically.

## 2015-06-08 ENCOUNTER — Telehealth: Payer: Self-pay

## 2015-06-08 NOTE — Telephone Encounter (Signed)
  Follow up Call-  Call back number 06/04/2015  Post procedure Call Back phone  # [phone]  Permission to leave phone message Yes     Patient questions:  Do you have a fever, pain , or abdominal swelling? No. Pain Score  0 *  Have you tolerated food without any problems? Yes.    Have you been able to return to your normal activities? Yes.    Do you have any questions about your discharge instructions: Diet   No. Medications  No. Follow up visit  No.  Do you have questions or concerns about your Care? No.  Actions: * If pain score is 4 or above: No action needed, pain <4.

## 2015-07-13 ENCOUNTER — Encounter: Payer: Self-pay | Admitting: Gastroenterology

## 2015-07-13 ENCOUNTER — Ambulatory Visit (INDEPENDENT_AMBULATORY_CARE_PROVIDER_SITE_OTHER): Payer: 59 | Admitting: Gastroenterology

## 2015-07-13 VITALS — BP 112/86 | HR 76 | Ht <= 58 in | Wt 136.1 lb

## 2015-07-13 DIAGNOSIS — K641 Second degree hemorrhoids: Secondary | ICD-10-CM

## 2015-07-13 NOTE — Progress Notes (Signed)
PROCEDURE NOTE: The patient presents with symptomatic grade II  hemorrhoids, requesting rubber band ligation of his/her hemorrhoidal disease.  All risks, benefits and alternative forms of therapy were described and informed consent was obtained.  In the Left Lateral Decubitus position anoscopic examination revealed grade II hemorrhoids in the right posterior and left lateral position(s).  The anorectum was pre-medicated with 0.125% nitroglycerin The decision was made to band the right posterior internal hemorrhoid, and the [place] was used to perform band ligation without complication.  Digital anorectal examination was then performed to assure proper positioning of the band, and to adjust the banded tissue as required.  The patient was discharged home without pain or other issues.  Dietary and behavioral recommendations were given and along with follow-up instructions.     The following adjunctive treatments were recommended:  Benefiber 1 tablespoon 3 times a day with meals  The patient will return in 2-4 weeks for  follow-up and possible additional banding as required. No complications were encountered and the patient tolerated the procedure well.  Damaris Hippo , MD [phone] Mon-Fri 8a-5p [phone] after 5p, weekends, holidays

## 2015-07-13 NOTE — Patient Instructions (Signed)
HEMORRHOID BANDING PROCEDURE    FOLLOW-UP CARE   1. The procedure you have had should have been relatively painless since the banding of the area involved does not have nerve endings and there is no pain sensation.  The rubber band cuts off the blood supply to the hemorrhoid and the band may fall off as soon as 48 hours after the banding (the band may occasionally be seen in the toilet bowl following a bowel movement). You may notice a temporary feeling of fullness in the rectum which should respond adequately to plain Tylenol or Motrin.  2. Following the banding, avoid strenuous exercise that evening and resume full activity the next day.  A sitz bath (soaking in a warm tub) or bidet is soothing, and can be useful for cleansing the area after bowel movements.     3. To avoid constipation, take two tablespoons of natural wheat bran, natural oat bran, flax, Benefiber or any over the counter fiber supplement and increase your water intake to 7-8 glasses daily.    4. Unless you have been prescribed anorectal medication, do not put anything inside your rectum for two weeks: No suppositories, enemas, fingers, etc.  5. Occasionally, you may have more bleeding than usual after the banding procedure.  This is often from the untreated hemorrhoids rather than the treated one.  Don't be concerned if there is a tablespoon or so of blood.  If there is more blood than this, lie flat with your bottom higher than your head and apply an ice pack to the area. If the bleeding does not stop within a half an hour or if you feel faint, call our office at [phone] or go to the emergency room.  6. Problems are not common; however, if there is a substantial amount of bleeding, severe pain, chills, fever or difficulty passing urine (very rare) or other problems, you should call us at [phone] or report to the nearest emergency room.  7. Do not stay seated continuously for more than 2-3 hours for a day or two  after the procedure.  Tighten your buttock muscles 10-15 times every two hours and take 10-15 deep breaths every 1-2 hours.  Do not spend more than a few minutes on the toilet if you cannot empty your bowel; instead re-visit the toilet at a later time.   Your 2nd banding is scheduled on 09/04/2015 at 3:30pm

## 2015-08-19 LAB — HM MAMMOGRAPHY

## 2015-08-21 ENCOUNTER — Encounter: Payer: Self-pay | Admitting: Internal Medicine

## 2015-09-04 ENCOUNTER — Encounter: Payer: 59 | Admitting: Gastroenterology

## 2016-02-23 DIAGNOSIS — Z01419 Encounter for gynecological examination (general) (routine) without abnormal findings: Secondary | ICD-10-CM | POA: Diagnosis not present

## 2016-03-01 ENCOUNTER — Telehealth: Payer: Self-pay | Admitting: Genetic Counselor

## 2016-03-01 ENCOUNTER — Encounter: Payer: Self-pay | Admitting: Genetic Counselor

## 2016-03-01 NOTE — Telephone Encounter (Signed)
Appt scheduled w/Karen poweel on 3/19 at 10am. Pt aware to arrive 30 minutes early. Demographics verified. Letter mailed to the pt.

## 2016-04-04 ENCOUNTER — Other Ambulatory Visit: Payer: Self-pay | Admitting: Internal Medicine

## 2016-04-07 ENCOUNTER — Other Ambulatory Visit: Payer: Self-pay | Admitting: Internal Medicine

## 2016-04-07 DIAGNOSIS — I10 Essential (primary) hypertension: Secondary | ICD-10-CM

## 2016-05-06 ENCOUNTER — Encounter: Payer: Self-pay | Admitting: Genetic Counselor

## 2016-05-09 ENCOUNTER — Other Ambulatory Visit: Payer: 59

## 2016-05-09 ENCOUNTER — Ambulatory Visit (HOSPITAL_BASED_OUTPATIENT_CLINIC_OR_DEPARTMENT_OTHER): Payer: 59 | Admitting: Genetic Counselor

## 2016-05-09 ENCOUNTER — Encounter: Payer: Self-pay | Admitting: Genetic Counselor

## 2016-05-09 DIAGNOSIS — Z8051 Family history of malignant neoplasm of kidney: Secondary | ICD-10-CM

## 2016-05-09 DIAGNOSIS — Z800 Family history of malignant neoplasm of digestive organs: Secondary | ICD-10-CM | POA: Diagnosis not present

## 2016-05-09 DIAGNOSIS — Z8279 Family history of other congenital malformations, deformations and chromosomal abnormalities: Secondary | ICD-10-CM | POA: Diagnosis not present

## 2016-05-09 NOTE — Progress Notes (Signed)
REFERRING PROVIDER: Binnie Rail, MD [place], [place] [address]  PRIMARY PROVIDER:  Binnie Rail, MD  PRIMARY REASON FOR VISIT:  1. Family history of Lynch syndrome   2. Family history of colon cancer   3. Family history of kidney cancer      HISTORY OF PRESENT ILLNESS:   Brenda Gomez, a 56 y.o. female, was seen for a [facility] cancer genetics consultation at the request of Dr. Quay Burow due to a family history of colon cancer and a known family mutation in MSH6 which is associated with Lynch syndrome.  Brenda Gomez presents to clinic today to discuss the possibility of a hereditary predisposition to cancer, genetic testing, and to further clarify her future cancer risks, as well as potential cancer risks for family members. Brenda Gomez is a 56 y.o. female with no personal history of cancer.  Her maternal half brother was diagnosed with colon cancer and was found to have Lynch syndrome.  CANCER HISTORY:   No history exists.     HORMONAL RISK FACTORS:  Menarche was at age 65.  First live birth at age 86.  OCP use for approximately <1 years.  Ovaries intact: yes.  Hysterectomy: no.  Menopausal status: postmenopausal.  HRT use: 0 years. Colonoscopy: yes; normal. Mammogram within the last year: yes. Number of breast biopsies: 0. Up to date with pelvic exams:  yes. Any excessive radiation exposure in the past:  no  Past Medical History:  Diagnosis Date  . Allergic rhinitis   . Allergy   . Blood transfusion without reported diagnosis   . Dyslipidemia   . Family history of colon cancer   . Family history of kidney cancer   . Family history of Lynch syndrome   . Hand tingling left hand , occasional  . Headache(784.0)    Dr Jannifer Franklin Rxed Nortriptyline  . History of cerebral embolism with infarction-  3 yrs ago,  age 63   residual left hand occasional numb/ tingle-- NEUROLOGIST  DR WILLIS-- LAST NOTE 02-25-2009 W/ CHART  . History of cerebrovascular accident with  residual effects   . History of kidney stones    Dr Junious Silk  . Hypertension   . PFO (patent foramen ovale) small- per Dr  Jannifer Franklin, positive transcranial bubble study, and tee echo  . Renal cyst left  . Stroke [facility]) 2010    Past Surgical History:  Procedure Laterality Date  . APPENDECTOMY  2003  . CARDIOVASCULAR STRESS TEST  2004   NORMAL  . CYSTOSCOPY W/ RETROGRADES  03/24/2011   Procedure: CYSTOSCOPY WITH RETROGRADE PYELOGRAM;  Surgeon: Fredricka Bonine, MD;  Location: [facility];  Service: Urology;  Laterality: Right;  RT RPG, RT URETERAL STENT, BLADDER BIOPSY AND FULGERATION   . [place] X2  , 2013   LEFT X 2; R X 1. Dr Junious Silk, [facility]  . NEPHROLITHOTOMY  1998   LEFT KIDNEY STONE  . TRANSCRANIAL DOPPLER  11-20-2008   POSITIVE BUBBLE STUDY - SMALL INTRACARDIAC RIGHT TO LEFT SHUNT  . TRANSESOPHAGEAL ECHOCARDIOGRAM  01-19-2009   SMALL PFO/ NO SIGNIFICANT VALVULAR DISEASE/ EF 60-65%  . WISDOM TOOTH EXTRACTION      Social History   Social History  . Marital status: Married    Spouse name: N/A  . Number of children: N/A  . Years of education: N/A   Social History Main Topics  . Smoking status: Former Smoker    Packs/day: 0.25  Years: 20.00    Types: Cigarettes    Quit date: 03/22/2010  . Smokeless tobacco: Never Used     Comment: smoked 1980- 2012, up to 1 pp WEEK  . Alcohol use No  . Drug use: No  . Sexual activity: Not Asked   Other Topics Concern  . None   Social History Narrative  . None     FAMILY HISTORY:  We obtained a detailed, 4-generation family history.  Significant diagnoses are listed below: Family History  Problem Relation Age of Onset  . Diabetes Mother   . Stroke Mother 66  . Nephrolithiasis Mother   . Cancer Sister 8    sarcoma; maternal half sister  . Diabetes Maternal Aunt   . Kidney cancer Maternal Aunt     dx in her 76s  . Diabetes Maternal Grandmother   . Heart  attack Maternal Grandmother     > 65  . Cancer Maternal Grandfather     melanoma with pulmonary mets  . Brain cancer Other     MGF's sister  . Colon cancer Brother 7    maternal half brother  . Lung cancer Paternal Uncle     smoker  . Lymphoma Paternal Grandmother     hodgkins  . Heart attack Paternal Grandfather   . Colon cancer Cousin     maternal first cousin dx in her 68s  . Cancer Other     NOS; MGFs brother  . Colon polyps Neg Hx   . Esophageal cancer Neg Hx   . Rectal cancer Neg Hx   . Stomach cancer Neg Hx     The patient has two sons who are cancer free. She does not have any full siblings, but has maternal half siblings - two brothers and a sister.  Her sister died at age 54 from a sarcoma, and her brother was diagnosed with colon cancer at 29 and was found to have Lynch syndrome.  Her mother is deceased at age 56 from diabetes and kidney failure. She had one sister who had kidney cancer in her 61's-70's.  This sister has a daughter who had colon cancer in her 53's.  The patient's maternal grandfather died of melanoma.  He had a brother who had smoking related lung cancer and a sister with a brain cancer.  His mother died of Hodgkin's lymphoma.  Her grandmother died of diabetes and kidney failure.  Her father had a non specified cancer, and her maternal aunt had possible breast cancer.  The patient's father is alive at 87.  He had a brother who died from smoking related lung cancer.  He ha two sisters and a brother who are living and cancer free.  His mother died of Hodgkin's lymphoma and his gather died from a heart attack.   Patient's maternal ancestors are of possible [place] descent, and paternal ancestors are of Caucasian descent. There is no reported Ashkenazi Jewish ancestry. There is no known consanguinity.  GENETIC COUNSELING ASSESSMENT: [name] is a 56 y.o. female with a family history of cancer and a known diagnosis of Lynch syndrome which is somewhat  suggestive of a predisposition to cancer. We, therefore, discussed and recommended the following at today's visit.   DISCUSSION: We discussed that about 5-6% of colon cancer is hereditary with most cases due to Lynch syndrome.  We discussed that the most common cancer associated with Lynch syndrome is colon cancer, with other cancers such as uterine and ovarian cancer being close behind.  We discussed that medical management for these cancers would be put in place if she tested positive.  Based on her maternal half brother being diagnosed with Lynch syndrome, and that we feel the gene is coming from their mother (shared relative), that her risk for testing positive is 50%.  We reviewed the characteristics, features and inheritance patterns of hereditary cancer syndromes. We also discussed genetic testing, including the appropriate family members to test, the process of testing, insurance coverage and turn-around-time for results. We discussed the implications of a negative, positive and/or variant of uncertain significant result. We recommended Ms. Parcel pursue genetic testing for the Common hereditary cancer gene panel. The Hereditary Gene Panel offered by [facility] includes sequencing and/or deletion duplication testing of the following 43 genes: APC, ATM, AXIN2, BARD1, BMPR1A, BRCA1, BRCA2, BRIP1, CDH1, CDKN2A (p14ARF), CDKN2A (p16INK4a), CHEK2, DICER1, EPCAM (Deletion/duplication testing only), GREM1 (promoter region deletion/duplication testing only), KIT, MEN1, MLH1, MSH2, MSH6, MUTYH, NBN, NF1, PALB2, PDGFRA, PMS2, POLD1, POLE, PTEN, RAD50, RAD51C, RAD51D, SDHB, SDHC, SDHD, SMAD4, SMARCA4. STK11, TP53, TSC1, TSC2, and VHL.  The following gene was evaluated for sequence changes only: SDHA and HOXB13 c.251G>A variant only.   Based on Ms. Pearse's family history of cancer, she meets medical criteria for genetic testing. Despite that she meets criteria, she may still have an out of pocket cost. We discussed  that if her out of pocket cost for testing is over $100, the laboratory will call and confirm whether she wants to proceed with testing.  If the out of pocket cost of testing is less than $100 she will be billed by the genetic testing laboratory.   PLAN: After considering the risks, benefits, and limitations, Ms. Scheper  provided informed consent to pursue genetic testing and the blood sample was sent to [facility] for analysis of the Common Hereditary cancer panel. Results should be available within approximately 2-3 weeks' time, at which point they will be disclosed by telephone to Ms. Kocher, as will any additional recommendations warranted by these results. Ms. Urias will receive a summary of her genetic counseling visit and a copy of her results once available. This information will also be available in Epic. We encouraged Ms. Corriveau to remain in contact with cancer genetics annually so that we can continuously update the family history and inform her of any changes in cancer genetics and testing that may be of benefit for her family. Ms. Brunetti questions were answered to her satisfaction today. Our contact information was provided should additional questions or concerns arise.  Lastly, we encouraged Ms. Mcshan to remain in contact with cancer genetics annually so that we can continuously update the family history and inform her of any changes in cancer genetics and testing that may be of benefit for this family.   Ms.  Newhall questions were answered to her satisfaction today. Our contact information was provided should additional questions or concerns arise. Thank you for the referral and allowing [place] to share in the care of your patient.   Treston Coker P. Florene Glen, [place], [facility] Certified Genetic Counselor [email or web address] phone: [phone]  The patient was seen for a total of 45 minutes in face-to-face genetic counseling.  This patient was discussed with Drs. Magrinat, Lindi Adie  and/or Burr Medico who agrees with the above.    _______________________________________________________________________ For Office Staff:  Number of people involved in session: 1 Was an Intern/ student involved with case: no

## 2016-05-30 ENCOUNTER — Telehealth: Payer: Self-pay | Admitting: Genetic Counselor

## 2016-05-30 ENCOUNTER — Encounter: Payer: Self-pay | Admitting: Genetic Counselor

## 2016-05-30 DIAGNOSIS — Z1379 Encounter for other screening for genetic and chromosomal anomalies: Secondary | ICD-10-CM | POA: Insufficient documentation

## 2016-05-30 NOTE — Telephone Encounter (Signed)
Revealed negative genetic testing.  She does not have the familial MSH6 mutation found in her brother.  Discussed that her other brother needs testing and that we could apply for financial assistance through one of the labs, however, there could be an office cost.  Also discussed that the lab has indicated that she is all settled up and that she will not be billed a second time.

## 2016-06-06 ENCOUNTER — Ambulatory Visit: Payer: Self-pay | Admitting: Genetic Counselor

## 2016-06-06 DIAGNOSIS — Z1379 Encounter for other screening for genetic and chromosomal anomalies: Secondary | ICD-10-CM

## 2016-06-06 DIAGNOSIS — Z800 Family history of malignant neoplasm of digestive organs: Secondary | ICD-10-CM

## 2016-06-06 DIAGNOSIS — Z8051 Family history of malignant neoplasm of kidney: Secondary | ICD-10-CM

## 2016-06-06 NOTE — Progress Notes (Signed)
HPI: Ms. Agro was previously seen in the [facility] due to a family history of colon cancer, and Lynch syndrome diagnosis in her brother, as well as a concern regarding a hereditary predisposition to cancer. Please refer to our prior cancer genetics clinic note for more information regarding Ms. Koslowski's medical, social and family histories, and our assessment and recommendations, at the time. Ms. Fulwider recent genetic test results were disclosed to her, as were recommendations warranted by these results. These results and recommendations are discussed in more detail below.  CANCER HISTORY:   No history exists.    FAMILY HISTORY:  We obtained a detailed, 4-generation family history.  Significant diagnoses are listed below: Family History  Problem Relation Age of Onset  . Diabetes Mother   . Stroke Mother 52  . Nephrolithiasis Mother   . Cancer Sister 8    sarcoma; maternal half sister  . Diabetes Maternal Aunt   . Kidney cancer Maternal Aunt     dx in her 61s  . Diabetes Maternal Grandmother   . Heart attack Maternal Grandmother     > 65  . Cancer Maternal Grandfather     melanoma with pulmonary mets  . Brain cancer Other     MGF's sister  . Colon cancer Brother 1    maternal half brother  . Lung cancer Paternal Uncle     smoker  . Lymphoma Paternal Grandmother     hodgkins  . Heart attack Paternal Grandfather   . Colon cancer Cousin     maternal first cousin dx in her 100s  . Cancer Other     NOS; MGFs brother  . Colon polyps Neg Hx   . Esophageal cancer Neg Hx   . Rectal cancer Neg Hx   . Stomach cancer Neg Hx     The patient has two sons who are cancer free. She does not have any full siblings, but has maternal half siblings - two brothers and a sister.  Her sister died at age 14 from a sarcoma, and her brother was diagnosed with colon cancer at 35 and was found to have Lynch syndrome.  Her mother is deceased at age 75 from diabetes and  kidney failure. She had one sister who had kidney cancer in her 62's-70's.  This sister has a daughter who had colon cancer in her 53's.  The patient's maternal grandfather died of melanoma.  He had a brother who had smoking related lung cancer and a sister with a brain cancer.  His mother died of Hodgkin's lymphoma.  Her grandmother died of diabetes and kidney failure.  Her father had a non specified cancer, and her maternal aunt had possible breast cancer.  The patient's father is alive at 43.  He had a brother who died from smoking related lung cancer.  He ha two sisters and a brother who are living and cancer free.  His mother died of Hodgkin's lymphoma and his gather died from a heart attack.  Patient's maternal ancestors are of possible [place] descent, and paternal ancestors are of Caucasian descent. There is no reported Ashkenazi Jewish ancestry. There is no known consanguinity.  GENETIC TEST RESULTS: Genetic testing reported out on May 27, 2016 through the Common Hereditary Cancers panel found no deleterious mutations. Ms. Heckman test was normal and did not reveal the familial mutation. We call this result a true negative result because the cancer-causing mutation was identified in Ms. Yakubov's family, and she did not inherit  it.  Given this negative result, Ms. Al's chances of developing Lynch syndrome-related cancers are the same as they are in the general population.  The Hereditary Gene Panel offered by [facility] includes sequencing and/or deletion duplication testing of the following 43 genes: APC, ATM, AXIN2, BARD1, BMPR1A, BRCA1, BRCA2, BRIP1, CDH1, CDKN2A (p14ARF), CDKN2A (p16INK4a), CHEK2, DICER1, EPCAM (Deletion/duplication testing only), GREM1 (promoter region deletion/duplication testing only), KIT, MEN1, MLH1, MSH2, MSH6, MUTYH, NBN, NF1, PALB2, PDGFRA, PMS2, POLD1, POLE, PTEN, RAD50, RAD51C, RAD51D, SDHB, SDHC, SDHD, SMAD4, SMARCA4. STK11, TP53, TSC1, TSC2, and VHL.  The  following gene was evaluated for sequence changes only: SDHA and HOXB13 c.251G>A variant only.  The test report has been scanned into EPIC and is located under the Molecular Pathology section of the Results Review tab.   We discussed with Ms. Michelle that since the current genetic testing is not perfect, it is possible there may be a gene mutation in one of these genes that current testing cannot detect, but that chance is small. We also discussed, that it is possible that another gene that has not yet been discovered, or that we have not yet tested, is responsible for the cancer diagnoses in the family, and it is, therefore, important to remain in touch with cancer genetics in the future so that we can continue to offer Ms. Madill the most up to date genetic testing.     CANCER SCREENING RECOMMENDATIONS:  This normal result is reassuring and indicates that Ms. Latini does not likely have an increased risk of cancer due to a mutation in one of these genes.  We, therefore, recommended  Ms. Smoot continue to follow the cancer screening guidelines provided by her primary healthcare providers.   RECOMMENDATIONS FOR FAMILY MEMBERS: We recommended women in this family have a yearly mammogram beginning at age 66, or 85 years younger than the earliest onset of cancer, an annual clinical breast exam, and perform monthly breast self-exams. Women in this family should also have a gynecological exam as recommended by their primary provider. All family members should have a colonoscopy by age 44.  Based on Ms. Vassey's family history, we recommended her maternal half brother, have genetic counseling and testing. Ms. Leone will let us know if we can be of any assistance in coordinating genetic counseling and/or testing for this family member.   FOLLOW-UP: Lastly, we discussed with Ms. Willig that cancer genetics is a rapidly advancing field and it is possible that new genetic tests will be appropriate for  her and/or her family members in the future. We encouraged her to remain in contact with cancer genetics on an annual basis so we can update her personal and family histories and let her know of advances in cancer genetics that may benefit this family.   Our contact number was provided. Ms. Denise questions were answered to her satisfaction, and she knows she is welcome to call us at anytime with additional questions or concerns.   Roma Kayser, MS, [facility] Certified Genetic Counselor [email or web address]

## 2016-06-16 ENCOUNTER — Encounter (HOSPITAL_COMMUNITY): Payer: Self-pay

## 2016-06-25 ENCOUNTER — Other Ambulatory Visit: Payer: Self-pay | Admitting: Internal Medicine

## 2016-06-26 ENCOUNTER — Other Ambulatory Visit: Payer: Self-pay | Admitting: Internal Medicine

## 2016-06-26 DIAGNOSIS — I10 Essential (primary) hypertension: Secondary | ICD-10-CM

## 2016-07-07 ENCOUNTER — Ambulatory Visit: Payer: 59 | Admitting: Nurse Practitioner

## 2016-07-07 ENCOUNTER — Telehealth: Payer: Self-pay | Admitting: Internal Medicine

## 2016-07-07 ENCOUNTER — Encounter (HOSPITAL_COMMUNITY): Payer: Self-pay | Admitting: *Deleted

## 2016-07-07 ENCOUNTER — Ambulatory Visit (HOSPITAL_COMMUNITY)
Admission: EM | Admit: 2016-07-07 | Discharge: 2016-07-07 | Disposition: A | Discharge status: Home / Self Care | Payer: 59 | Attending: Internal Medicine | Admitting: Internal Medicine

## 2016-07-07 DIAGNOSIS — R739 Hyperglycemia, unspecified: Secondary | ICD-10-CM | POA: Insufficient documentation

## 2016-07-07 DIAGNOSIS — Z7982 Long term (current) use of aspirin: Secondary | ICD-10-CM | POA: Diagnosis not present

## 2016-07-07 DIAGNOSIS — R3129 Other microscopic hematuria: Secondary | ICD-10-CM | POA: Diagnosis not present

## 2016-07-07 DIAGNOSIS — E785 Hyperlipidemia, unspecified: Secondary | ICD-10-CM | POA: Insufficient documentation

## 2016-07-07 DIAGNOSIS — Z87891 Personal history of nicotine dependence: Secondary | ICD-10-CM | POA: Diagnosis not present

## 2016-07-07 DIAGNOSIS — I10 Essential (primary) hypertension: Secondary | ICD-10-CM | POA: Diagnosis not present

## 2016-07-07 DIAGNOSIS — Z8673 Personal history of transient ischemic attack (TIA), and cerebral infarction without residual deficits: Secondary | ICD-10-CM | POA: Insufficient documentation

## 2016-07-07 DIAGNOSIS — R109 Unspecified abdominal pain: Secondary | ICD-10-CM | POA: Diagnosis present

## 2016-07-07 DIAGNOSIS — N23 Unspecified renal colic: Secondary | ICD-10-CM

## 2016-07-07 DIAGNOSIS — Z79899 Other long term (current) drug therapy: Secondary | ICD-10-CM | POA: Insufficient documentation

## 2016-07-07 DIAGNOSIS — Z87442 Personal history of urinary calculi: Secondary | ICD-10-CM | POA: Diagnosis not present

## 2016-07-07 LAB — POCT URINALYSIS DIP (DEVICE)
Bilirubin Urine: NEGATIVE
Glucose, UA: NEGATIVE mg/dL
KETONES UR: NEGATIVE mg/dL
Nitrite: NEGATIVE
PH: 5.5 (ref 5.0–8.0)
PROTEIN: NEGATIVE mg/dL
Specific Gravity, Urine: <=1.005 (ref 1.005–1.030)
UROBILINOGEN UA: 0.2 mg/dL (ref 0.0–1.0)

## 2016-07-07 MED ORDER — CEPHALEXIN 500 MG PO CAPS
500.0000 mg | ORAL_CAPSULE | Freq: Two times a day (BID) | ORAL | 0 refills | Status: AC
Start: 2016-07-07 — End: 2016-07-12

## 2016-07-07 MED ORDER — HYDROCODONE-ACETAMINOPHEN 5-325 MG PO TABS
1.0000 | ORAL_TABLET | Freq: Four times a day (QID) | ORAL | 0 refills | Status: DC | PRN
Start: 2016-07-07 — End: 2017-08-09

## 2016-07-07 MED ORDER — ONDANSETRON HCL 4 MG PO TABS: 8.0000 mg | ORAL_TABLET | ORAL | 0 refills | Status: DC | PRN

## 2016-07-07 NOTE — ED Provider Notes (Signed)
[place]    CSN: [id number] Arrival date & time: 07/07/16  1016     History   Chief Complaint Chief Complaint  Patient presents with  . Flank Pain    HPI Brenda Gomez is a 56 y.o. female. Presents today with right flank pain onset yesterday, couldn't get comfortable overnight.  Took a percocet and then was itchy all night, with some nausea and emesis x 1 today.  Urinary frequency for the last week.  No dysuria.  No gross hematuria.  No unusual vag bleeding/discharge.  Decreased freq BMs x 1wk.  No fever, no malaise.   Hx kidney stones on the left, required lithotripsy.   HPI  Past Medical History:  Diagnosis Date  . Allergic rhinitis   . Allergy   . Blood transfusion without reported diagnosis   . Dyslipidemia   . Family history of colon cancer   . Family history of kidney cancer   . Family history of Lynch syndrome   . Hand tingling left hand , occasional  . Headache(784.0)    Dr Jannifer Franklin Rxed Nortriptyline  . History of cerebral embolism with infarction-  3 yrs ago,  age 33   residual left hand occasional numb/ tingle-- NEUROLOGIST  DR WILLIS-- LAST NOTE 02-25-2009 W/ CHART  . History of cerebrovascular accident with residual effects   . History of kidney stones    Dr Junious Silk  . Hypertension   . PFO (patent foramen ovale) small- per Dr  Jannifer Franklin, positive transcranial bubble study, and tee echo  . Renal cyst left  . Stroke [facility]) 2010    Patient Active Problem List   Diagnosis Date Noted  . Genetic testing 05/30/2016  . Family history of Lynch syndrome 05/09/2016  . Family history of colon cancer   . Family history of kidney cancer   . History of CVA (cerebrovascular accident) 12/02/2008  . PARESTHESIA 08/20/2008  . Hyperglycemia 08/20/2008  . Essential hypertension 08/16/2007  . NEPHROLITHIASIS, HX OF 08/16/2007  . HYPERLIPIDEMIA 04/06/2007    Past Surgical History:  Procedure Laterality Date  . APPENDECTOMY  2003  . CARDIOVASCULAR  STRESS TEST  2004   NORMAL  . CYSTOSCOPY W/ RETROGRADES  03/24/2011   Procedure: CYSTOSCOPY WITH RETROGRADE PYELOGRAM;  Surgeon: Fredricka Bonine, MD;  Location: [facility];  Service: Urology;  Laterality: Right;  RT RPG, RT URETERAL STENT, BLADDER BIOPSY AND FULGERATION   . [place] X2  , 2013   LEFT X 2; R X 1. Dr Junious Silk, [facility]  . NEPHROLITHOTOMY  1998   LEFT KIDNEY STONE  . TRANSCRANIAL DOPPLER  11-20-2008   POSITIVE BUBBLE STUDY - SMALL INTRACARDIAC RIGHT TO LEFT SHUNT  . TRANSESOPHAGEAL ECHOCARDIOGRAM  01-19-2009   SMALL PFO/ NO SIGNIFICANT VALVULAR DISEASE/ EF 60-65%  . WISDOM TOOTH EXTRACTION        Home Medications    Prior to Admission medications   Medication Sig Start Date End Date Taking? Authorizing Provider  aspirin 81 MG tablet Take 1 tablet (81 mg total) by mouth daily. 04/02/11   Festus Aloe, MD  Calcium Carbonate (CALTRATE 600 PO) Take by mouth. Reported on 06/04/2015    [provider]  cephALEXin (KEFLEX) 500 MG capsule Take 1 capsule (500 mg total) by mouth 2 (two) times daily. 07/07/16 07/12/16  Sherlene Shams, MD  cetirizine (ZYRTEC) 10 MG tablet Take 10 mg by mouth every evening.    [provider]  diltiazem [place])  90 MG tablet TAKE 1 TABLET( 90 MG TOTAL) BY MOUTH 2 TIMES DAILY --- Office visit needed for further refills 04/07/16   Binnie Rail, MD  fish oil-omega-3 fatty acids 1000 MG capsule Take 1 g by mouth 2 (two) times daily.     [provider]  HYDROcodone-acetaminophen (NORCO/VICODIN) 5-325 MG tablet Take 1 tablet by mouth 4 (four) times daily as needed. 07/07/16   Sherlene Shams, MD  lisinopril (PRINIVIL,ZESTRIL) 20 MG tablet Take 1 tablet (20 mg total) by mouth 2 (two) times daily. --- Office visit needed for further refills 04/07/16   Binnie Rail, MD  Multiple Vitamin (MULITIVITAMIN WITH MINERALS) TABS Take 1 tablet by mouth daily.    [provider]  ondansetron (ZOFRAN) 4 MG tablet Take 2 tablets (8 mg total) by mouth every 4 (four) hours as needed for nausea or vomiting. 07/07/16   Sherlene Shams, MD  pravastatin (PRAVACHOL) 20 MG tablet Take 1 tablet (20 mg total) by mouth at bedtime. --- Office visit needed for further refills 04/04/16   Binnie Rail, MD    Family History Family History  Problem Relation Age of Onset  . Diabetes Mother   . Stroke Mother 30  . Nephrolithiasis Mother   . Cancer Sister 8       sarcoma; maternal half sister  . Diabetes Maternal Aunt   . Kidney cancer Maternal Aunt        dx in her 66s  . Diabetes Maternal Grandmother   . Heart attack Maternal Grandmother        > 65  . Cancer Maternal Grandfather        melanoma with pulmonary mets  . Brain cancer Other        MGF's sister  . Colon cancer Brother 27       maternal half brother  . Lung cancer Paternal Uncle        smoker  . Lymphoma Paternal Grandmother        hodgkins  . Heart attack Paternal Grandfather   . Colon cancer Cousin        maternal first cousin dx in her 86s  . Cancer Other        NOS; MGFs brother  . Colon polyps Neg Hx   . Esophageal cancer Neg Hx   . Rectal cancer Neg Hx   . Stomach cancer Neg Hx     Social History Social History  Substance Use Topics  . Smoking status: Former Smoker    Packs/day: 0.25    Years: 20.00    Types: Cigarettes    Quit date: 03/22/2010  . Smokeless tobacco: Never Used     Comment: smoked 1980- 2012, up to 1 pp WEEK  . Alcohol use No     Allergies   Codeine   Review of Systems Review of Systems  All other systems reviewed and are negative.    Physical Exam Triage Vital Signs ED Triage Vitals  Enc Vitals Group     BP 109/70     Pulse Rate 87     Resp 16     Temp 98.5 F (36.9 C)     Temp Source Oral     SpO2 98 %     Weight      Height      Pain Score 4     Pain Loc    Updated Vital Signs BP 109/70 (BP Location: Right Arm)   Pulse 87  Temp  98.5 F (36.9 C) (Oral)   Resp 16   SpO2 98%   Physical Exam  Constitutional: She is oriented to person, place, and time. No distress.  HENT:  Head: Atraumatic.  Eyes:  Conjugate gaze observed, no eye redness/discharge  Neck: Neck supple.  Cardiovascular: Normal rate and regular rhythm.   Pulmonary/Chest: No respiratory distress. She has no wheezes. She has no rales.  Lungs clear, symmetric breath sounds.  Abdominal: She exhibits no distension.  Musculoskeletal: Normal range of motion.  No CVAT  Neurological: She is alert and oriented to person, place, and time.  Skin: Skin is warm and dry.  Nursing note and vitals reviewed.    UC Treatments / Results  Labs Results for orders placed or performed during the hospital encounter of 07/07/16  Urine culture  Result Value Ref Range   Specimen Description URINE, RANDOM    Special Requests NONE    Culture NO GROWTH    Report Status 07/08/2016 FINAL   POCT urinalysis dip (device)  Result Value Ref Range   Glucose, UA NEGATIVE NEGATIVE mg/dL   Bilirubin Urine NEGATIVE NEGATIVE   Ketones, ur NEGATIVE NEGATIVE mg/dL   Specific Gravity, Urine <=1.005 1.005 - 1.030   Hgb urine dipstick MODERATE (A) NEGATIVE   pH 5.5 5.0 - 8.0   Protein, ur NEGATIVE NEGATIVE mg/dL   Urobilinogen, UA 0.2 0.0 - 1.0 mg/dL   Nitrite NEGATIVE NEGATIVE   Leukocytes, UA LARGE (A) NEGATIVE    Procedures Procedures (including critical care time) None today  Final Clinical Impressions(s) / UC Diagnoses   Final diagnoses:  Ureteral colic  Hematuria, microscopic  Personal history of kidney stones   Flank pain and urinary frequency are suggestive of a kidney stone on the right.  A urine culture is pending.  Prescriptions for cephalexin (antibiotic), ondansetron (for nausea) were sent to the [facility] on [address].  A prescription for a small number of vicodin were printed, for severe pain.  Note for work today.  Followup with Dr Eskridge/urology to discuss  further evaluation/management for flank pain/blood in urine.    New Prescriptions New Prescriptions   CEPHALEXIN (KEFLEX) 500 MG CAPSULE    Take 1 capsule (500 mg total) by mouth 2 (two) times daily.   HYDROCODONE-ACETAMINOPHEN (NORCO/VICODIN) 5-325 MG TABLET    Take 1 tablet by mouth 4 (four) times daily as needed.   ONDANSETRON (ZOFRAN) 4 MG TABLET    Take 2 tablets (8 mg total) by mouth every 4 (four) hours as needed for nausea or vomiting.     Sherlene Shams, MD 07/10/16 [id number]

## 2016-07-07 NOTE — Telephone Encounter (Signed)
°  Patient Name: Brenda Gomez  DOB: 1960/10/15    Initial Comment Caller states she has had right side pain all night.   Nurse Assessment  Nurse: Andria Frames, RN, Aeriel Date/Time (Eastern Time): 07/07/2016 8:20:47 AM  Confirm and document reason for call. If symptomatic, describe symptoms. ---Caller states, she has had right sided flank pain all night. She has noticed all week she is peeing more frequently when she goes there is not a lot. She feels a little pressure there. She hasn't thought anything about it. Then yesterday she started noticing that, her right side became touchy. Last night as the night went on it was hurting so bad she woke up and she couldn't go to sleep. Caller states, on a scale of zero to 10 her pain is a 7-8.  Does the patient have any new or worsening symptoms? ---Yes  Will a triage be completed? ---Yes  Related visit to physician within the last 2 weeks? ---No  Does the PT have any chronic conditions? (i.e. diabetes, asthma, etc.) ---Yes  List chronic conditions. ---htn  Is the patient pregnant or possibly pregnant? (Ask all females between the ages of 101-55) ---No  Is this a behavioral health or substance abuse call? ---No     Guidelines    Guideline Title Affirmed Question Affirmed Notes  Flank Pain Vomiting    Final Disposition User   Go to ED Now (or PCP triage) Hensel, RN, Aeriel    Comments  Caller states, the pain is not severe more of a 6 ot [facility] - ED   Disagree/Comply: Comply

## 2016-07-07 NOTE — Discharge Instructions (Addendum)
Flank pain and urinary frequency are suggestive of a kidney stone on the right.  A urine culture is pending.  Prescriptions for cephalexin (antibiotic), ondansetron (for nausea) [facility] [address].  A prescription for a small number of vicodin were printed, for severe pain.  Note for work today.  Followup with Dr Eskridge/urology to discuss further evaluation/management for flank pain/blood in urine.

## 2016-07-07 NOTE — ED Triage Notes (Signed)
Pt  Reports  Pain  r   Side       Since  yest   Pt  Reports  Had  Some  Urinary  Urgency    All  Week     Had   Some  Nausea    aftyer  Taking  A  Pain pill  Last  pm

## 2016-07-08 LAB — URINE CULTURE: Culture: NO GROWTH

## 2016-07-10 ENCOUNTER — Other Ambulatory Visit: Payer: Self-pay | Admitting: Internal Medicine

## 2016-07-10 DIAGNOSIS — I10 Essential (primary) hypertension: Secondary | ICD-10-CM

## 2016-07-11 ENCOUNTER — Encounter (HOSPITAL_COMMUNITY)
Admission: RE | Disposition: A | Discharge status: Home / Self Care | Payer: Self-pay | Source: Ambulatory Visit | Attending: Urology

## 2016-07-11 ENCOUNTER — Ambulatory Visit (HOSPITAL_COMMUNITY)
Admission: RE | Admit: 2016-07-11 | Discharge: 2016-07-11 | Disposition: A | Discharge status: Home / Self Care | Payer: 59 | Source: Ambulatory Visit | Attending: Urology | Admitting: Urology

## 2016-07-11 ENCOUNTER — Encounter (HOSPITAL_COMMUNITY): Payer: Self-pay | Admitting: Anesthesiology

## 2016-07-11 ENCOUNTER — Ambulatory Visit (HOSPITAL_COMMUNITY): Payer: 59 | Admitting: Anesthesiology

## 2016-07-11 ENCOUNTER — Ambulatory Visit (HOSPITAL_COMMUNITY): Payer: 59

## 2016-07-11 ENCOUNTER — Other Ambulatory Visit: Payer: Self-pay | Admitting: Urology

## 2016-07-11 DIAGNOSIS — N201 Calculus of ureter: Secondary | ICD-10-CM | POA: Diagnosis not present

## 2016-07-11 DIAGNOSIS — Z87891 Personal history of nicotine dependence: Secondary | ICD-10-CM | POA: Insufficient documentation

## 2016-07-11 DIAGNOSIS — Z7982 Long term (current) use of aspirin: Secondary | ICD-10-CM | POA: Insufficient documentation

## 2016-07-11 DIAGNOSIS — N200 Calculus of kidney: Secondary | ICD-10-CM | POA: Diagnosis present

## 2016-07-11 DIAGNOSIS — Z79899 Other long term (current) drug therapy: Secondary | ICD-10-CM | POA: Insufficient documentation

## 2016-07-11 DIAGNOSIS — I10 Essential (primary) hypertension: Secondary | ICD-10-CM | POA: Insufficient documentation

## 2016-07-11 DIAGNOSIS — N132 Hydronephrosis with renal and ureteral calculous obstruction: Secondary | ICD-10-CM | POA: Diagnosis not present

## 2016-07-11 DIAGNOSIS — R8271 Bacteriuria: Secondary | ICD-10-CM | POA: Diagnosis not present

## 2016-07-11 DIAGNOSIS — R1031 Right lower quadrant pain: Secondary | ICD-10-CM | POA: Diagnosis not present

## 2016-07-11 DIAGNOSIS — Z8673 Personal history of transient ischemic attack (TIA), and cerebral infarction without residual deficits: Secondary | ICD-10-CM | POA: Diagnosis not present

## 2016-07-11 DIAGNOSIS — E785 Hyperlipidemia, unspecified: Secondary | ICD-10-CM | POA: Diagnosis not present

## 2016-07-11 HISTORY — PX: CYSTOSCOPY WITH RETROGRADE PYELOGRAM, URETEROSCOPY AND STENT PLACEMENT: SHX5789

## 2016-07-11 LAB — CBC
HEMATOCRIT: 37.7 % (ref 36.0–46.0)
Hemoglobin: 12.7 g/dL (ref 12.0–15.0)
MCH: 29.4 pg (ref 26.0–34.0)
MCHC: 33.7 g/dL (ref 30.0–36.0)
MCV: 87.3 fL (ref 78.0–100.0)
PLATELETS: 312 10*3/uL (ref 150–400)
RBC: 4.32 MIL/uL (ref 3.87–5.11)
RDW: 12.8 % (ref 11.5–15.5)
WBC: 7.8 10*3/uL (ref 4.0–10.5)

## 2016-07-11 LAB — BASIC METABOLIC PANEL
Anion gap: 10 (ref 5–15)
BUN: 16 mg/dL (ref 6–20)
CALCIUM: 9.3 mg/dL (ref 8.9–10.3)
CO2: 25 mmol/L (ref 22–32)
CREATININE: 0.88 mg/dL (ref 0.44–1.00)
Chloride: 107 mmol/L (ref 101–111)
GFR calc Af Amer: >60 mL/min (ref >60)
GFR calc non Af Amer: >60 mL/min (ref >60)
Glucose, Bld: 91 mg/dL (ref 65–99)
Potassium: 4 mmol/L (ref 3.5–5.1)
Sodium: 142 mmol/L (ref 135–145)

## 2016-07-11 SURGERY — CYSTOURETEROSCOPY, WITH RETROGRADE PYELOGRAM AND STENT INSERTION
Anesthesia: General | Site: Ureter | Laterality: Right

## 2016-07-11 MED ORDER — ACETAMINOPHEN 650 MG RE SUPP
650.0000 mg | RECTAL | Status: DC | PRN
  Filled 2016-07-11: qty 1

## 2016-07-11 MED ORDER — HYDROCODONE-ACETAMINOPHEN 5-325 MG PO TABS
1.0000 | ORAL_TABLET | ORAL | Status: DC | PRN
  Administered 2016-07-11: 1 via ORAL
  Filled 2016-07-11: qty 1

## 2016-07-11 MED ORDER — MIDAZOLAM HCL 2 MG/2ML IJ SOLN
INTRAMUSCULAR | Status: AC
  Filled 2016-07-11: qty 2

## 2016-07-11 MED ORDER — ACETAMINOPHEN 325 MG PO TABS: 650.0000 mg | ORAL_TABLET | ORAL | Status: DC | PRN

## 2016-07-11 MED ORDER — PHENAZOPYRIDINE HCL 200 MG PO TABS: 200.0000 mg | ORAL_TABLET | Freq: Three times a day (TID) | ORAL | 0 refills | Status: DC | PRN

## 2016-07-11 MED ORDER — ONDANSETRON HCL 4 MG/2ML IJ SOLN
INTRAMUSCULAR | Status: DC | PRN
  Administered 2016-07-11: 4 mg via INTRAVENOUS

## 2016-07-11 MED ORDER — PROPOFOL 10 MG/ML IV BOLUS
INTRAVENOUS | Status: DC | PRN
  Administered 2016-07-11: 120 mg via INTRAVENOUS

## 2016-07-11 MED ORDER — SODIUM CHLORIDE 0.9% FLUSH: 3.0000 mL | INTRAVENOUS | Status: DC | PRN

## 2016-07-11 MED ORDER — LIDOCAINE 2% (20 MG/ML) 5 ML SYRINGE
INTRAMUSCULAR | Status: DC | PRN
  Administered 2016-07-11: 50 mg via INTRAVENOUS

## 2016-07-11 MED ORDER — HYDROMORPHONE HCL 1 MG/ML IJ SOLN: 0.2500 mg | INTRAMUSCULAR | Status: DC | PRN

## 2016-07-11 MED ORDER — CEFAZOLIN SODIUM-DEXTROSE 2-4 GM/100ML-% IV SOLN
2.0000 g | INTRAVENOUS | Status: AC
Start: 2016-07-11 — End: 2016-07-11
  Administered 2016-07-11: 2 g via INTRAVENOUS
  Filled 2016-07-11: qty 100

## 2016-07-11 MED ORDER — PROPOFOL 10 MG/ML IV BOLUS
INTRAVENOUS | Status: AC
  Filled 2016-07-11: qty 20

## 2016-07-11 MED ORDER — MIDAZOLAM HCL 5 MG/5ML IJ SOLN
INTRAMUSCULAR | Status: DC | PRN
  Administered 2016-07-11: 2 mg via INTRAVENOUS

## 2016-07-11 MED ORDER — MEPERIDINE HCL 50 MG/ML IJ SOLN: 6.2500 mg | INTRAMUSCULAR | Status: DC | PRN

## 2016-07-11 MED ORDER — SODIUM CHLORIDE 0.9 % IR SOLN
Status: DC | PRN
  Administered 2016-07-11: 4000 mL via INTRAVESICAL

## 2016-07-11 MED ORDER — FENTANYL CITRATE (PF) 100 MCG/2ML IJ SOLN
INTRAMUSCULAR | Status: AC
  Filled 2016-07-11: qty 2

## 2016-07-11 MED ORDER — MORPHINE SULFATE (PF) 4 MG/ML IV SOLN: 2.0000 mg | INTRAVENOUS | Status: DC | PRN

## 2016-07-11 MED ORDER — LACTATED RINGERS IV SOLN
INTRAVENOUS | Status: DC
  Administered 2016-07-11: 14:00:00 via INTRAVENOUS

## 2016-07-11 MED ORDER — FENTANYL CITRATE (PF) 100 MCG/2ML IJ SOLN
INTRAMUSCULAR | Status: DC | PRN
  Administered 2016-07-11: 25 ug via INTRAVENOUS
  Administered 2016-07-11: 50 ug via INTRAVENOUS
  Administered 2016-07-11: 25 ug via INTRAVENOUS

## 2016-07-11 MED ORDER — SODIUM CHLORIDE 0.9% FLUSH: 3.0000 mL | Freq: Two times a day (BID) | INTRAVENOUS | Status: DC

## 2016-07-11 MED ORDER — METOCLOPRAMIDE HCL 5 MG/ML IJ SOLN: 10.0000 mg | Freq: Once | INTRAMUSCULAR | Status: DC | PRN

## 2016-07-11 MED ORDER — OXYCODONE HCL 5 MG PO TABS
5.0000 mg | ORAL_TABLET | ORAL | Status: DC | PRN
  Filled 2016-07-11: qty 1

## 2016-07-11 MED ORDER — SODIUM CHLORIDE 0.9 % IV SOLN: 250.0000 mL | INTRAVENOUS | Status: DC | PRN

## 2016-07-11 SURGICAL SUPPLY — 25 items
BAG URO CATCHER STRL LF (MISCELLANEOUS) ×3 IMPLANT
BASKET STONE NCOMPASS (UROLOGICAL SUPPLIES) IMPLANT
BASKET ZERO TIP NITINOL 2.4FR (BASKET) ×2 IMPLANT
BSKT STON RTRVL ZERO TP 2.4FR (BASKET) ×1
CATH URET 5FR 28IN OPEN ENDED (CATHETERS) IMPLANT
CATH URET DUAL LUMEN 6-10FR 50 (CATHETERS) ×3 IMPLANT
CLOTH BEACON ORANGE TIMEOUT ST (SAFETY) ×3 IMPLANT
COVER SURGICAL LIGHT HANDLE (MISCELLANEOUS) ×3 IMPLANT
EXTRACTOR STONE NITINOL NGAGE (UROLOGICAL SUPPLIES) ×3 IMPLANT
FIBER LASER FLEXIVA 1000 (UROLOGICAL SUPPLIES) IMPLANT
FIBER LASER FLEXIVA 365 (UROLOGICAL SUPPLIES) IMPLANT
FIBER LASER FLEXIVA 550 (UROLOGICAL SUPPLIES) IMPLANT
GLOVE SURG SS PI 8.0 STRL IVOR (GLOVE) IMPLANT
GOWN STRL REUS W/TWL XL LVL3 (GOWN DISPOSABLE) ×3 IMPLANT
GUIDEWIRE STR DUAL SENSOR (WIRE) ×3 IMPLANT
IV NS 1000ML (IV SOLUTION)
IV NS 1000ML BAXH (IV SOLUTION) ×1 IMPLANT
IV NS IRRIG 3000ML ARTHROMATIC (IV SOLUTION) ×1 IMPLANT
MANIFOLD NEPTUNE II (INSTRUMENTS) ×3 IMPLANT
PACK CYSTO (CUSTOM PROCEDURE TRAY) ×3 IMPLANT
SHEATH ACCESS URETERAL 38CM (SHEATH) ×3 IMPLANT
SHEATH URET ACCESS 10/12FR (MISCELLANEOUS) IMPLANT
STENT URET 6FRX24 CONTOUR (STENTS) ×2 IMPLANT
TUBING CONNECTING 10 (TUBING) ×2 IMPLANT
TUBING CONNECTING 10' (TUBING) ×1

## 2016-07-11 NOTE — Anesthesia Procedure Notes (Signed)
Procedure Name: LMA Insertion Date/Time: 07/11/2016 3:48 PM Performed by: Anne Fu Pre-anesthesia Checklist: Patient identified, Emergency Drugs available, Suction available, Patient being monitored and Timeout performed Patient Re-evaluated:Patient Re-evaluated prior to inductionOxygen Delivery Method: Circle system utilized Preoxygenation: Pre-oxygenation with 100% oxygen Intubation Type: IV induction Ventilation: Mask ventilation without difficulty LMA: LMA inserted LMA Size: 3.0 Number of attempts: 1 Placement Confirmation: positive ETCO2 and breath sounds checked- equal and bilateral Tube secured with: Tape

## 2016-07-11 NOTE — Anesthesia Preprocedure Evaluation (Addendum)
Anesthesia Evaluation  Patient identified by MRN, date of birth, ID band Patient awake    Reviewed: Allergy & Precautions, NPO status , Patient's Chart, lab work & pertinent test results  Airway Mallampati: III  TM Distance: >3 FB Neck ROM: Full    Dental  (+) Partial Upper, Missing,    Pulmonary former smoker,    Pulmonary exam normal breath sounds clear to auscultation       Cardiovascular hypertension, Pt. on medications Normal cardiovascular exam Rhythm:Regular Rate:Normal  Hx/o PFO   Neuro/Psych  Headaches, Intermittent numbness left hand CVA, Residual Symptoms negative psych ROS   GI/Hepatic Neg liver ROS, Family Hx/o Lynch syndrome   Endo/Other  negative endocrine ROS  Renal/GU Renal diseaseRight ureteral calculus  negative genitourinary   Musculoskeletal negative musculoskeletal ROS (+)   Abdominal   Peds  Hematology negative hematology ROS (+)   Anesthesia Other Findings   Reproductive/Obstetrics                           Anesthesia Physical Anesthesia Plan  ASA: III  Anesthesia Plan: General   Post-op Pain Management:    Induction: Intravenous  Airway Management Planned: LMA  Additional Equipment:   Intra-op Plan: Utilization Of Total Body Hypothermia per surgeon request  Post-operative Plan: Extubation in OR  Informed Consent: I have reviewed the patients History and Physical, chart, labs and discussed the procedure including the risks, benefits and alternatives for the proposed anesthesia with the patient or authorized representative who has indicated his/her understanding and acceptance.   Dental advisory given  Plan Discussed with: CRNA, Anesthesiologist and Surgeon  Anesthesia Plan Comments: (No air bubbles in IV tubing.)       Anesthesia Quick Evaluation

## 2016-07-11 NOTE — Transfer of Care (Deleted)
Immediate Anesthesia Transfer of Care Note  Patient: Brenda Gomez  Procedure(s) Performed: Procedure(s): CYSTOSCOPY, URETEROSCOPY AND RIGHT STENT PLACEMENT, POSSIBLE LASER (Right)  Patient Location: PACU  Anesthesia Type:General  Level of Consciousness:  sedated, patient cooperative and responds to stimulation  Airway & Oxygen Therapy:Patient Spontanous Breathing and Patient connected to face mask oxgen  Post-op Assessment:  Report given to PACU RN and Post -op Vital signs reviewed and stable  Post vital signs:  Reviewed and stable  Last Vitals:  Vitals:   07/11/16 1626 07/11/16 1627  BP: 124/83   Pulse:  89  Resp:  19  Temp:  34.1 C    Complications: No apparent anesthesia complications

## 2016-07-11 NOTE — Transfer of Care (Signed)
Immediate Anesthesia Transfer of Care Note  Patient: Brenda Gomez  Procedure(s) Performed: Procedure(s): CYSTOSCOPY, URETEROSCOPY AND RIGHT STENT PLACEMENT, POSSIBLE LASER (Right)  Patient Location: PACU  Anesthesia Type:General  Level of Consciousness:  sedated, patient cooperative and responds to stimulation  Airway & Oxygen Therapy:Patient Spontanous Breathing and Patient connected to face mask oxgen  Post-op Assessment:  Report given to PACU RN and Post -op Vital signs reviewed and stable  Post vital signs:  Reviewed and stable  Last Vitals:  Vitals:   07/11/16 1626 07/11/16 1627  BP: 124/83   Pulse:  89  Resp:  19  Temp:  17.4 C    Complications: No apparent anesthesia complications

## 2016-07-11 NOTE — Anesthesia Postprocedure Evaluation (Signed)
Anesthesia Post Note  Patient: Brenda Gomez  Procedure(s) Performed: Procedure(s) (LRB): CYSTOSCOPY, URETEROSCOPY AND RIGHT STENT PLACEMENT, POSSIBLE LASER (Right)  Patient location during evaluation: PACU Anesthesia Type: General Level of consciousness: awake and alert and oriented Pain management: pain level controlled Vital Signs Assessment: post-procedure vital signs reviewed and stable Respiratory status: spontaneous breathing, nonlabored ventilation and respiratory function stable Cardiovascular status: blood pressure returned to baseline and stable Postop Assessment: no signs of nausea or vomiting Anesthetic complications: no       Last Vitals:  Vitals:   07/11/16 1650 07/11/16 1658  BP: (!) 139/92 123/80  Pulse: 87 84  Resp: 13 14  Temp: 37.2 C 36.7 C    Last Pain:  Vitals:   07/11/16 1650  TempSrc:   PainSc: 0-No pain                 Tajay Muzzy A.

## 2016-07-11 NOTE — Discharge Instructions (Signed)
Ureteral Stent Implantation, Care After Refer to this sheet in the next few weeks. These instructions provide you with information about caring for yourself after your procedure. Your health care provider may also give you more specific instructions. Your treatment has been planned according to current medical practices, but problems sometimes occur. Call your health care provider if you have any problems or questions after your procedure. What can I expect after the procedure? After the procedure, it is common to have:  Nausea.  Mild pain when you urinate. You may feel this pain in your lower back or lower abdomen. Pain should stop within a few minutes after you urinate. This may last for up to 1 week.  A small amount of blood in your urine for several days. Follow these instructions at home:   Medicines   Take over-the-counter and prescription medicines only as told by your health care provider.  If you were prescribed an antibiotic medicine, take it as told by your health care provider. Do not stop taking the antibiotic even if you start to feel better.  Do not drive for 24 hours if you received a sedative.  Do not drive or operate heavy machinery while taking prescription pain medicines. Activity   Return to your normal activities as told by your health care provider. Ask your health care provider what activities are safe for you.  Do not lift anything that is heavier than 10 lb (4.5 kg). Follow this limit for 1 week after your procedure, or for as long as told by your health care provider. General instructions   Watch for any blood in your urine. Call your health care provider if the amount of blood in your urine increases.  If you have a catheter:  Follow instructions from your health care provider about taking care of your catheter and collection bag.  Do not take baths, swim, or use a hot tub until your health care provider approves.  Drink enough fluid to keep your urine  clear or pale yellow.  Keep all follow-up visits as told by your health care provider. This is important. Contact a health care provider if:  You have pain that gets worse or does not get better with medicine, especially pain when you urinate.  You have difficulty urinating.  You feel nauseous or you vomit repeatedly during a period of more than 2 days after the procedure. Get help right away if:  Your urine is dark red or has blood clots in it.  You are leaking urine (have incontinence).  The end of the stent comes out of your urethra.  You cannot urinate.  You have sudden, sharp, or severe pain in your abdomen or lower back.  You have a fever.  You may remove the stent by pulling the string from the urethra on Thursday morning.  If you don't feel comfortable doing this or you can't find the string, please call the office.   This information is not intended to replace advice given to you by your health care provider. Make sure you discuss any questions you have with your health care provider. Document Released: 10/10/2012 Document Revised: 07/16/2015 Document Reviewed: 08/22/2014 Elsevier Interactive Patient Education  2017 [employer].

## 2016-07-11 NOTE — Brief Op Note (Signed)
07/11/2016  4:12 PM  PATIENT:  Brenda Gomez  56 y.o. female  PRE-OPERATIVE DIAGNOSIS:  right ureteral stone  POST-OPERATIVE DIAGNOSIS:  right ureteral stone  PROCEDURE:  Procedure(s): CYSTOSCOPY, URETEROSCOPY  With stone EXTRACTION AND RIGHT STENT PLACEMENT  SURGEON:  Surgeon(s) and Role:    Irine Seal, MD - Primary  PHYSICIAN ASSISTANT:   ASSISTANTS: none   ANESTHESIA:   general  EBL:  No intake/output data recorded.  BLOOD ADMINISTERED:none  DRAINS: right 6 x 24 JJ stent   LOCAL MEDICATIONS USED:  NONE  SPECIMEN:  Source of Specimen:  right ureteral stone  DISPOSITION OF SPECIMEN:  to patient  COUNTS:  YES  TOURNIQUET:  * No tourniquets in log *  DICTATION: .Other Dictation: Dictation Number [id number]  PLAN OF CARE: Discharge to home after PACU  PATIENT DISPOSITION:  PACU - hemodynamically stable.   Delay start of Pharmacological VTE agent (>24hrs) due to surgical blood loss or risk of bleeding: not applicable

## 2016-07-11 NOTE — H&P (Signed)
I have kidney stones.  HPI: Brenda Gomez is a 56 year-old female patient who was referred by Dr. Darlyn Chamber, MD who is here for renal calculi.  She first stated noticing pain on approximately 07/04/2016.   Brenda Gomez is a 56 yo WF with a history of stones who was last seen in 2013 following right ureteral stenting and ESWL. Her left kidney is atrophic with multiple stones and there was a residual untreated right renal stone on her last KUB. She returns today with flank pain and chills. She had the onset last week of urgency and then Weds she began to have right flank pain that was severe enough to require pain med which she had on hand. The pain felt like a punch. The next day she had increase pressure. She felt feverish and had a chill. Her temp was 100.4 and she is taking tylenol. She was seen in an Urgent care on Thursday and she was found to have hematuria. She didn't have any imaging. She has some low back aching now. She still has the urgency but no pain today. She is on Keflex from the Urgent care.      ALLERGIES: Codeine Derivatives    MEDICATIONS: Aspirin 81 MG TABS Oral  Cephalexin 500 mg capsule  DilTIAZem HCl ER 90 MG Oral Capsule Extended Release 12 Hour Oral  Fish Oil CAPS Oral  Hydrocodone-Acetaminophen 5-325 MG Oral Tablet 6 Oral  Lisinopril 20 MG Oral Tablet Oral  Multi-Vitamin TABS Oral  Ondansetron Hcl 4 mg tablet 0 Oral  Pravastatin Sodium 20 MG Oral Tablet Oral  Zyrtec 10 MG TABS Oral     GU PSH: Cysto Fulgurate < 0.5 cm - 2013 Cystoscopy Insert Stent - 2013 ESWL - 2013, 2013, 2013      [place] Notes: Lithotripsy, Cystoscopy With Fulguration Minor Lesion (Under 64mm), Cystoscopy With Insertion Of Ureteral Stent Right, Lithotripsy, Lithotripsy, Appendectomy   NON-GU PSH: Appendectomy - 2013    GU PMH: Other microscopic hematuria, Microscopic hematuria - 2014 Renal calculus, Nephrolithiasis - 2014 RLQ pain, Abdominal pain, RLQ (right lower quadrant) -  2014 Ureteral calculus, Calculus of ureter - 2014 Urinary Tract Inf, Unspec site, Pyuria - 2014      PMH Notes:  2011-03-09 16:10:02 - Note: Normal Routine History And Physical Adult   NON-GU PMH: Cerebral infarction, unspecified, Stroke Syndrome - 2014 Personal history of other diseases of the circulatory system, History of hypertension - 2014 Hypertension    FAMILY HISTORY: Cancer - Runs In Family Diabetes - Runs In Family Family Health Status - Father alive at age 82 - 55 In Family Family Health Status - Mother's Age - Runs In Family Hypertension - Runs In Family Kidney Cancer - Aunt nephrolithiasis - Son Nonfunctioning Kidney - Runs In Family   SOCIAL HISTORY: Marital Status: Married Current Smoking Status: Patient does not smoke anymore.   Tobacco Use Assessment Completed: Used Tobacco in last 30 days? Has never drank.  Drinks 2 caffeinated drinks per day. Patient's occupation Engineer, maintenance.     Notes: Former smoker, Marital History - Currently Married, Occupation:, Caffeine Use, Alcohol Use   2 sons   REVIEW OF SYSTEMS:    GU Review Female:   Patient reports frequent urination. Patient denies hard to postpone urination, burning /pain with urination, get up at night to urinate, leakage of urine, stream starts and stops, trouble starting your stream, have to strain to urinate, and being pregnant.  Gastrointestinal (Upper):   Patient reports nausea and  vomiting. Patient denies indigestion/ heartburn.  Gastrointestinal (Lower):   Patient denies constipation and diarrhea.  Constitutional:   Patient reports fever. Patient denies night sweats, weight loss, and fatigue.  Skin:   Patient denies skin rash/ lesion and itching.  Eyes:   Patient denies blurred vision and double vision.  Ears/ Nose/ Throat:   Patient denies sore throat and sinus problems.  Hematologic/Lymphatic:   Patient denies swollen glands and easy bruising.  Cardiovascular:   Patient denies leg swelling and  chest pains.  Respiratory:   Patient denies cough and shortness of breath.  Endocrine:   Patient denies excessive thirst.  Musculoskeletal:   Patient denies back pain and joint pain.  Neurological:   Patient denies headaches and dizziness.  Psychologic:   Patient denies depression and anxiety.   VITAL SIGNS:      07/11/2016 08:59 AM  Weight 137 lb / 62.14 kg  Height 59 in / 149.86 cm  BP 105/73 mmHg  Pulse 76 /min  Temperature 98.6 F / 37 C  BMI 27.7 kg/m   MULTI-SYSTEM PHYSICAL EXAMINATION:    Constitutional: Well-nourished. No physical deformities. Normally developed. Good grooming.  Neck: Neck symmetrical, not swollen. Normal tracheal position.  Respiratory: No labored breathing, no use of accessory muscles. CTA  Cardiovascular: Normal temperature, RRR without murmur  Lymphatic: No enlargement, no tenderness of supraclavicular and neck lymph nodes.  Skin: No paleness, no jaundice, no cyanosis. No lesion, no ulcer, no rash.  Neurologic / Psychiatric: Oriented to time, oriented to place, oriented to person. No depression, no anxiety, no agitation.  Gastrointestinal: No mass, no tenderness, no rigidity, non obese abdomen.   Musculoskeletal: Normal gait and station of head and neck.     PAST DATA REVIEWED:  Source Of History:  Patient  X-Ray Review: C.T. Stone Protocol: Reviewed Films. Discussed With Patient.     PROCEDURES:         C.T. Urogram - 74176  69mm RUVJ stone with obstruction. 2 right renal stones. Atrophied left kidney with stable stone disease. Report pending.                Urinalysis w/Scope Dipstick Dipstick Cont'd Micro  Color: Yellow Bilirubin: Neg WBC/hpf: 40 - 60/hpf  Appearance: Clear Ketones: Neg RBC/hpf: 0 - 2/hpf  Specific Gravity: 1.020 Blood: 1+ Bacteria: NS (Not Seen)  pH: 5.5 Protein: Neg Cystals: NS (Not Seen)  Glucose: Neg Urobilinogen: 0.2 Casts: NS (Not Seen)    Nitrites: Neg Trichomonas: Not Present    Leukocyte Esterase: 2+ Mucous: Not  Present      Epithelial Cells: 0 - 5/hpf      Yeast: Few (1 - 5/hpf)      Sperm: Not Present    ASSESSMENT:      ICD-10 Details  1 GU:   Ureteral calculus - N20.1 Right, She has a 83mm RUVJ stone with obstruction and a possible UTI. I am going to get her set up for later today for cystoscopy, possible right ureteroscopy with stone extraction and stenting. Risks of bleeding, infection, ureteral injury, need for a stent or secondary procedures, thrombotic events and anesthetic complications reviewed.   2   RLQ pain - R10.31    PLAN:           Orders Labs Urine Culture  X-Rays: C.T. Stone Protocol Without Contrast  X-Ray Notes: History:  Hematuria: Yes/No  Patient to see MD after exam: Yes/No  Previous exam: CT / IVP/ US/ KUB/ None  When:  Where:  Diabetic: Yes/ No  BUN/ Creatinine:  Date of last BUN Creatinine:  Weight in pounds:  Allergy- IV Contrast: Yes/ No  Conflicting diabetic meds: Yes/ No  Diabetic Meds:  Prior Authorization #: [id number]           Schedule Return Visit/Planned Activity: ASAP - Schedule Surgery          Document Letter(s):  Created for Patient: Clinical Summary         Notes:   CC: Dr. Billey Gosling.

## 2016-07-12 ENCOUNTER — Other Ambulatory Visit: Payer: Self-pay | Admitting: *Deleted

## 2016-07-12 ENCOUNTER — Encounter (HOSPITAL_COMMUNITY): Payer: Self-pay | Admitting: Urology

## 2016-07-12 DIAGNOSIS — I10 Essential (primary) hypertension: Secondary | ICD-10-CM

## 2016-07-12 MED ORDER — DILTIAZEM HCL 90 MG PO TABS: 90.0000 mg | ORAL_TABLET | Freq: Two times a day (BID) | ORAL | 0 refills | Status: DC

## 2016-07-12 MED ORDER — PRAVASTATIN SODIUM 20 MG PO TABS: 20.0000 mg | ORAL_TABLET | Freq: Every day | ORAL | 0 refills | Status: DC

## 2016-07-12 MED ORDER — LISINOPRIL 20 MG PO TABS: 20.0000 mg | ORAL_TABLET | Freq: Two times a day (BID) | ORAL | 0 refills | Status: DC

## 2016-07-12 NOTE — Telephone Encounter (Signed)
Left msg on triage she has made appt for 08/01/16 for annual, but needing refills until she come in for appt bcz she is completely out of medication. Called pt back inform per office policy can send 30 supply to local pharmacy until appt...Brenda Gomez

## 2016-07-12 NOTE — Op Note (Signed)
NAME:  Brenda Gomez, Brenda Gomez                   ACCOUNT NO.:  MEDICAL RECORD NO.:  [id number]  LOCATION:                                 FACILITY:  PHYSICIAN:  Marshall Cork. Jeffie Pollock, M.D.         DATE OF BIRTH:  DATE OF PROCEDURE:  07/11/2016 DATE OF DISCHARGE:                              OPERATIVE REPORT   The patient of Dr. Irine Seal.  PROCEDURE:  Cystoscopy with right ureteroscopic stone extraction and placement of right double-J stent.  PREOPERATIVE DIAGNOSIS:  A 7 mm right distal stone.  POSTOPERATIVE DIAGNOSIS:  A 7 mm right distal stone.  SURGEON:  Marshall Cork. Jeffie Pollock, M.D.  ANESTHESIA:  General.  SPECIMEN:  Stone.  DRAIN:  6-French x 24 cm double-J stent with string.  BLOOD LOSS:  None.  COMPLICATIONS:  None.  INDICATIONS:  [name] is a 56 year old white female with history of stones, who presented with a 2-week history of some chills without fever and right flank pain.  CT in the ER demonstrated a 7 mm right distal ureteral stone with obstruction.  She has elected to undergo ureteroscopy.  FINDINGS AND PROCEDURE:  She was taken to the operating room where general anesthetic was induced.  She was given Ancef.  She was placed in lithotomy position, was fitted with PAS hose.  Her perineum and genitalia were prepped with Betadine solution.  She was draped in usual sterile fashion.  Cystoscopy was performed using the 23-French scope and 30-degree lens. Examination revealed a normal urethra.  The bladder wall had mild trabeculation and some changes consistent with cystitis cystica.  The left ureteral orifice was unremarkable.  The right ureteral orifice was erythematous with a stone crowning at the orifice with some inflammatory debris adherent to the stone.  A guidewire was then passed by the stone.  There was brisk efflux of minimally turbid urine.  A 38 cm digital access sheath inner core was used to dilate the distal ureter.  She then underwent ureteroscopy with a  6.5-French semi-rigid scope.  The stone was grasped with a Zero tip basket, removed intact.  The cystoscope was then reinserted over the guidewire and a 24-cm 6- [place] contour double-J stent was inserted to the kidney under fluoroscopic guidance.  The wire was removed leaving good coil in the kidney, good coil in the bladder.  The bladder was drained.  The cystoscope was removed leaving the stent string exiting urethra.  The string was tied close to the meatus, then trimmed and tucked vaginally. The patient was taken down from lithotomy position.  Her anesthetic was reversed.  She was moved to recovery room in stable condition.  There were no complications.     Marshall Cork. Jeffie Pollock, M.D.     JJW/MEDQ  D:  07/11/2016  T:  07/12/2016  Job:  [id number]

## 2016-07-13 ENCOUNTER — Telehealth: Payer: Self-pay | Admitting: Emergency Medicine

## 2016-07-13 DIAGNOSIS — I10 Essential (primary) hypertension: Secondary | ICD-10-CM

## 2016-07-13 MED ORDER — LISINOPRIL 20 MG PO TABS: 20.0000 mg | ORAL_TABLET | Freq: Two times a day (BID) | ORAL | 0 refills | Status: DC

## 2016-07-13 NOTE — Telephone Encounter (Signed)
The 40 mg pill is not scored so we can not change to 40 mg 1/2 twice daily.  Ask her if she think she can try 40 mg at one time

## 2016-07-13 NOTE — Telephone Encounter (Signed)
Received fax from [facility] stating insurance company will only pay for 1 tablet per day for the Lisinopril. Please advise.

## 2016-07-14 NOTE — Telephone Encounter (Signed)
ok 

## 2016-07-14 NOTE — Telephone Encounter (Signed)
Spoke with pt. She states when she was using [facility] she was getting the 40 mg tablet and using a pill cutter to break the tablet in half. If you are okay with her doing that I will send 40 mg to [facility]

## 2016-07-15 MED ORDER — LISINOPRIL 40 MG PO TABS: ORAL_TABLET | ORAL | 1 refills | Status: DC

## 2016-07-15 NOTE — Telephone Encounter (Signed)
RX sent to [facility] 

## 2016-07-25 DIAGNOSIS — N201 Calculus of ureter: Secondary | ICD-10-CM | POA: Diagnosis not present

## 2016-07-25 DIAGNOSIS — N200 Calculus of kidney: Secondary | ICD-10-CM | POA: Diagnosis not present

## 2016-07-31 NOTE — Patient Instructions (Addendum)
Test(s) ordered today. Your results will be released to MyChart (or called to you) after review, usually within 72hours after test completion. If any changes need to be made, you will be notified at that same time.  All other Health Maintenance issues reviewed.   All recommended immunizations and age-appropriate screenings are up-to-date or discussed.  No immunizations administered today.   Medications reviewed and updated.  No changes recommended at this time.  Your prescription(s) have been submitted to your pharmacy. Please take as directed and contact our office if you believe you are having problem(s) with the medication(s).   Please followup in one year   Health Maintenance, Female Adopting a healthy lifestyle and getting preventive care can go a long way to promote health and wellness. Talk with your health care provider about what schedule of regular examinations is right for you. This is a good chance for you to check in with your provider about disease prevention and staying healthy. In between checkups, there are plenty of things you can do on your own. Experts have done a lot of research about which lifestyle changes and preventive measures are most likely to keep you healthy. Ask your health care provider for more information. Weight and diet Eat a healthy diet  Be sure to include plenty of vegetables, fruits, low-fat dairy products, and lean protein.  Do not eat a lot of foods high in solid fats, added sugars, or salt.  Get regular exercise. This is one of the most important things you can do for your health. ? Most adults should exercise for at least 150 minutes each week. The exercise should increase your heart rate and make you sweat (moderate-intensity exercise). ? Most adults should also do strengthening exercises at least twice a week. This is in addition to the moderate-intensity exercise.  Maintain a healthy weight  Body mass index (BMI) is a measurement that can  be used to identify possible weight problems. It estimates body fat based on height and weight. Your health care provider can help determine your BMI and help you achieve or maintain a healthy weight.  For females 20 years of age and older: ? A BMI below 18.5 is considered underweight. ? A BMI of 18.5 to 24.9 is normal. ? A BMI of 25 to 29.9 is considered overweight. ? A BMI of 30 and above is considered obese.  Watch levels of cholesterol and blood lipids  You should start having your blood tested for lipids and cholesterol at 54 years of age, then have this test every 5 years.  You may need to have your cholesterol levels checked more often if: ? Your lipid or cholesterol levels are high. ? You are older than 54 years of age. ? You are at high risk for heart disease.  Cancer screening Lung Cancer  Lung cancer screening is recommended for adults 55-80 years old who are at high risk for lung cancer because of a history of smoking.  A yearly low-dose CT scan of the lungs is recommended for people who: ? Currently smoke. ? Have quit within the past 15 years. ? Have at least a 30-pack-year history of smoking. A pack year is smoking an average of one pack of cigarettes a day for 1 year.  Yearly screening should continue until it has been 15 years since you quit.  Yearly screening should stop if you develop a health problem that would prevent you from having lung cancer treatment.  Breast Cancer  Practice breast self-awareness.   means understanding how your breasts normally appear and feel.  It also means doing regular breast self-exams. Let your health care provider know about any changes, no matter how small.  If you are in your 20s or 30s, you should have a clinical breast exam (CBE) by a health care provider every 1-3 years as part of a regular health exam.  If you are 40 or older, have a CBE every year. Also consider having a breast X-ray (mammogram) every year.  If you  have a family history of breast cancer, talk to your health care provider about genetic screening.  If you are at high risk for breast cancer, talk to your health care provider about having an MRI and a mammogram every year.  Breast cancer gene (BRCA) assessment is recommended for women who have family members with BRCA-related cancers. BRCA-related cancers include: ? Breast. ? Ovarian. ? Tubal. ? Peritoneal cancers.  Results of the assessment will determine the need for genetic counseling and BRCA1 and BRCA2 testing.  Cervical Cancer Your health care provider may recommend that you be screened regularly for cancer of the pelvic organs (ovaries, uterus, and vagina). This screening involves a pelvic examination, including checking for microscopic changes to the surface of your cervix (Pap test). You may be encouraged to have this screening done every 3 years, beginning at age 21.  For women ages 30-65, health care providers may recommend pelvic exams and Pap testing every 3 years, or they may recommend the Pap and pelvic exam, combined with testing for human papilloma virus (HPV), every 5 years. Some types of HPV increase your risk of cervical cancer. Testing for HPV may also be done on women of any age with unclear Pap test results.  Other health care providers may not recommend any screening for nonpregnant women who are considered low risk for pelvic cancer and who do not have symptoms. Ask your health care provider if a screening pelvic exam is right for you.  If you have had past treatment for cervical cancer or a condition that could lead to cancer, you need Pap tests and screening for cancer for at least 20 years after your treatment. If Pap tests have been discontinued, your risk factors (such as having a new sexual partner) need to be reassessed to determine if screening should resume. Some women have medical problems that increase the chance of getting cervical cancer. In these cases,  your health care provider may recommend more frequent screening and Pap tests.  Colorectal Cancer  This type of cancer can be detected and often prevented.  Routine colorectal cancer screening usually begins at 54 years of age and continues through 54 years of age.  Your health care provider may recommend screening at an earlier age if you have risk factors for colon cancer.  Your health care provider may also recommend using home test kits to check for hidden blood in the stool.  A small camera at the end of a tube can be used to examine your colon directly (sigmoidoscopy or colonoscopy). This is done to check for the earliest forms of colorectal cancer.  Routine screening usually begins at age 50.  Direct examination of the colon should be repeated every 5-10 years through 54 years of age. However, you may need to be screened more often if early forms of precancerous polyps or small growths are found.  Skin Cancer  Check your skin from head to toe regularly.  Tell your health care provider about any new   moles or changes in moles, especially if there is a change in a mole's shape or color.  Also tell your health care provider if you have a mole that is larger than the size of a pencil eraser.  Always use sunscreen. Apply sunscreen liberally and repeatedly throughout the day.  Protect yourself by wearing long sleeves, pants, a wide-brimmed hat, and sunglasses whenever you are outside.  Heart disease, diabetes, and high blood pressure  High blood pressure causes heart disease and increases the risk of stroke. High blood pressure is more likely to develop in: ? People who have blood pressure in the high end of the normal range (130-139/85-89 mm Hg). ? People who are overweight or obese. ? People who are African American.  If you are 18-39 years of age, have your blood pressure checked every 3-5 years. If you are 40 years of age or older, have your blood pressure checked every year.  You should have your blood pressure measured twice-once when you are at a hospital or clinic, and once when you are not at a hospital or clinic. Record the average of the two measurements. To check your blood pressure when you are not at a hospital or clinic, you can use: ? An automated blood pressure machine at a pharmacy. ? A home blood pressure monitor.  If you are between 55 years and 79 years old, ask your health care provider if you should take aspirin to prevent strokes.  Have regular diabetes screenings. This involves taking a blood sample to check your fasting blood sugar level. ? If you are at a normal weight and have a low risk for diabetes, have this test once every three years after 54 years of age. ? If you are overweight and have a high risk for diabetes, consider being tested at a younger age or more often. Preventing infection Hepatitis B  If you have a higher risk for hepatitis B, you should be screened for this virus. You are considered at high risk for hepatitis B if: ? You were born in a country where hepatitis B is common. Ask your health care provider which countries are considered high risk. ? Your parents were born in a high-risk country, and you have not been immunized against hepatitis B (hepatitis B vaccine). ? You have HIV or AIDS. ? You use needles to inject street drugs. ? You live with someone who has hepatitis B. ? You have had sex with someone who has hepatitis B. ? You get hemodialysis treatment. ? You take certain medicines for conditions, including cancer, organ transplantation, and autoimmune conditions.  Hepatitis C  Blood testing is recommended for: ? Everyone born from 1945 through 1965. ? Anyone with known risk factors for hepatitis C.  Sexually transmitted infections (STIs)  You should be screened for sexually transmitted infections (STIs) including gonorrhea and chlamydia if: ? You are sexually active and are younger than 54 years of  age. ? You are older than 54 years of age and your health care provider tells you that you are at risk for this type of infection. ? Your sexual activity has changed since you were last screened and you are at an increased risk for chlamydia or gonorrhea. Ask your health care provider if you are at risk.  If you do not have HIV, but are at risk, it may be recommended that you take a prescription medicine daily to prevent HIV infection. This is called pre-exposure prophylaxis (PrEP). You are considered at risk   if: ? You are sexually active and do not regularly use condoms or know the HIV status of your partner(s). ? You take drugs by injection. ? You are sexually active with a partner who has HIV.  Talk with your health care provider about whether you are at high risk of being infected with HIV. If you choose to begin PrEP, you should first be tested for HIV. You should then be tested every 3 months for as long as you are taking PrEP. Pregnancy  If you are premenopausal and you may become pregnant, ask your health care provider about preconception counseling.  If you may become pregnant, take 400 to 800 micrograms (mcg) of folic acid every day.  If you want to prevent pregnancy, talk to your health care provider about birth control (contraception). Osteoporosis and menopause  Osteoporosis is a disease in which the bones lose minerals and strength with aging. This can result in serious bone fractures. Your risk for osteoporosis can be identified using a bone density scan.  If you are 75 years of age or older, or if you are at risk for osteoporosis and fractures, ask your health care provider if you should be screened.  Ask your health care provider whether you should take a calcium or vitamin D supplement to lower your risk for osteoporosis.  Menopause may have certain physical symptoms and risks.  Hormone replacement therapy may reduce some of these symptoms and risks. Talk to your health  care provider about whether hormone replacement therapy is right for you. Follow these instructions at home:  Schedule regular health, dental, and eye exams.  Stay current with your immunizations.  Do not use any tobacco products including cigarettes, chewing tobacco, or electronic cigarettes.  If you are pregnant, do not drink alcohol.  If you are breastfeeding, limit how much and how often you drink alcohol.  Limit alcohol intake to no more than 1 drink per day for nonpregnant women. One drink equals 12 ounces of beer, 5 ounces of wine, or 1 ounces of hard liquor.  Do not use street drugs.  Do not share needles.  Ask your health care provider for help if you need support or information about quitting drugs.  Tell your health care provider if you often feel depressed.  Tell your health care provider if you have ever been abused or do not feel safe at home. This information is not intended to replace advice given to you by your health care provider. Make sure you discuss any questions you have with your health care provider. Document Released: 08/23/2010 Document Revised: 07/16/2015 Document Reviewed: 11/11/2014 Elsevier Interactive Patient Education  [employer].

## 2016-07-31 NOTE — Progress Notes (Signed)
Subjective:    Patient ID: Brenda Gomez, female    DOB: May 11, 1960, 56 y.o.   MRN: [id number]  HPI She is here for a physical exam.   She had a kidney stone recently.   She denies changes in her health other than the kidney stone.    Medications and allergies reviewed with patient and updated if appropriate.  Patient Active Problem List   Diagnosis Date Noted  . Genetic testing 05/30/2016  . Family history of Lynch syndrome 05/09/2016  . Family history of colon cancer   . Family history of kidney cancer   . History of CVA (cerebrovascular accident) 12/02/2008  . PARESTHESIA 08/20/2008  . Hyperglycemia 08/20/2008  . Essential hypertension 08/16/2007  . NEPHROLITHIASIS, HX OF 08/16/2007  . HYPERLIPIDEMIA 04/06/2007    Current Outpatient Prescriptions on File Prior to Visit  Medication Sig Dispense Refill  . aspirin 81 MG tablet Take 1 tablet (81 mg total) by mouth daily. 30 tablet   . Calcium Carbonate (CALTRATE 600 PO) Take by mouth. Reported on 06/04/2015    . cetirizine (ZYRTEC) 10 MG tablet Take 10 mg by mouth every evening.    . diltiazem (CARDIZEM) 90 MG tablet Take 1 tablet (90 mg total) by mouth 2 (two) times daily. Must keep June 11th appt for future refills 60 tablet 0  . fish oil-omega-3 fatty acids 1000 MG capsule Take 1 g by mouth 2 (two) times daily.     Marland Kitchen gabapentin (NEURONTIN) 100 MG capsule Take 200 mg by mouth at bedtime.  5  . HYDROcodone-acetaminophen (NORCO/VICODIN) 5-325 MG tablet Take 1 tablet by mouth 4 (four) times daily as needed. 10 tablet 0  . lisinopril (PRINIVIL,ZESTRIL) 40 MG tablet Take 1/2 tablet twice daily 90 tablet 1  . Multiple Vitamin (MULITIVITAMIN WITH MINERALS) TABS Take 1 tablet by mouth daily.    . ondansetron (ZOFRAN) 4 MG tablet Take 2 tablets (8 mg total) by mouth every 4 (four) hours as needed for nausea or vomiting. 20 tablet 0  . phenazopyridine (PYRIDIUM) 200 MG tablet Take 1 tablet (200 mg total) by mouth 3 (three) times  daily as needed for pain. 10 tablet 0  . pravastatin (PRAVACHOL) 20 MG tablet Take 1 tablet (20 mg total) by mouth at bedtime. Must keep June 11th appt for future refills 30 tablet 0   No current facility-administered medications on file prior to visit.     Past Medical History:  Diagnosis Date  . Allergic rhinitis   . Allergy   . Blood transfusion without reported diagnosis   . Dyslipidemia   . Family history of colon cancer   . Family history of kidney cancer   . Family history of Lynch syndrome   . Hand tingling left hand , occasional  . Headache(784.0)    Dr Jannifer Franklin Rxed Nortriptyline  . History of cerebral embolism with infarction-  3 yrs ago,  age 29   residual left hand occasional numb/ tingle-- NEUROLOGIST  DR WILLIS-- LAST NOTE 02-25-2009 W/ CHART  . History of cerebrovascular accident with residual effects   . History of kidney stones    Dr Junious Silk  . Hypertension   . PFO (patent foramen ovale) small- per Dr  Jannifer Franklin, positive transcranial bubble study, and tee echo  . Renal cyst left  . Stroke [facility]) 2010    Past Surgical History:  Procedure Laterality Date  . APPENDECTOMY  2003  . CARDIOVASCULAR STRESS TEST  2004   NORMAL  . CYSTOSCOPY  W/ RETROGRADES  03/24/2011   Procedure: CYSTOSCOPY WITH RETROGRADE PYELOGRAM;  Surgeon: Fredricka Bonine, MD;  Location: [facility];  Service: Urology;  Laterality: Right;  RT RPG, RT URETERAL STENT, BLADDER BIOPSY AND FULGERATION   . CYSTOSCOPY WITH RETROGRADE PYELOGRAM, URETEROSCOPY AND STENT PLACEMENT Right 07/11/2016   Procedure: CYSTOSCOPY, URETEROSCOPY AND RIGHT STENT PLACEMENT;  Surgeon: Irine Seal, MD;  Location: [facility];  Service: Urology;  Laterality: Right;  . [place] X2  , 2013   LEFT X 2; R X 1. Dr Junious Silk, [facility]  . NEPHROLITHOTOMY  1998   LEFT KIDNEY STONE  . TRANSCRANIAL DOPPLER  11-20-2008   POSITIVE BUBBLE STUDY - SMALL INTRACARDIAC RIGHT TO LEFT  SHUNT  . TRANSESOPHAGEAL ECHOCARDIOGRAM  01-19-2009   SMALL PFO/ NO SIGNIFICANT VALVULAR DISEASE/ EF 60-65%  . WISDOM TOOTH EXTRACTION      Social History   Social History  . Marital status: Married    Spouse name: N/A  . Number of children: N/A  . Years of education: N/A   Social History Main Topics  . Smoking status: Former Smoker    Packs/day: 0.25    Years: 20.00    Types: Cigarettes    Quit date: 03/22/2010  . Smokeless tobacco: Never Used     Comment: smoked 1980- 2012, up to 1 pp WEEK  . Alcohol use No  . Drug use: No  . Sexual activity: Not Asked   Other Topics Concern  . None   Social History Narrative   Exercise: doing [employer] bar    Family History  Problem Relation Age of Onset  . Diabetes Mother   . Stroke Mother 12  . Nephrolithiasis Mother   . Cancer Sister 8       sarcoma; maternal half sister  . Diabetes Maternal Aunt   . Kidney cancer Maternal Aunt        dx in her 14s  . Diabetes Maternal Grandmother   . Heart attack Maternal Grandmother        > 65  . Cancer Maternal Grandfather        melanoma with pulmonary mets  . Brain cancer Other        MGF's sister  . Colon cancer Brother 19       maternal half brother  . Lung cancer Paternal Uncle        smoker  . Lymphoma Paternal Grandmother        hodgkins  . Heart attack Paternal Grandfather   . Colon cancer Cousin        maternal first cousin dx in her 24s  . Cancer Other        NOS; MGFs brother  . Colon polyps Neg Hx   . Esophageal cancer Neg Hx   . Rectal cancer Neg Hx   . Stomach cancer Neg Hx     Review of Systems  Constitutional: Negative for chills and fever.  Eyes: Negative for visual disturbance.  Respiratory: Negative for cough, shortness of breath and wheezing.   Cardiovascular: Negative for chest pain, palpitations and leg swelling.  Gastrointestinal: Negative for abdominal pain, blood in stool, constipation, diarrhea and nausea.       Rare gerd  Genitourinary: Negative  for dysuria and hematuria.  Musculoskeletal: Positive for neck stiffness. Negative for arthralgias and back pain.  Skin: Negative for color change and rash.  Neurological: Positive for headaches. Negative for light-headedness.  Psychiatric/Behavioral: Negative for dysphoric mood. The  patient is not nervous/anxious.        Objective:   Vitals:   08/01/16 1405  BP: 120/72  Pulse: 98  Resp: 16  Temp: 98.8 F (37.1 C)   Filed Weights   08/01/16 1405  Weight: 136 lb (61.7 kg)   Body mass index is 26.56 kg/m.  Wt Readings from Last 3 Encounters:  08/01/16 136 lb (61.7 kg)  07/11/16 133 lb 4 oz (60.4 kg)  07/13/15 136 lb 2 oz (61.7 kg)     Physical Exam Constitutional: She appears well-developed and well-nourished. No distress.  HENT:  Head: Normocephalic and atraumatic.  Right Ear: External ear normal. Normal ear canal and TM Left Ear: External ear normal.  Normal ear canal and TM Mouth/Throat: Oropharynx is clear and moist.  Eyes: Conjunctivae and EOM are normal.  Neck: Neck supple. No tracheal deviation present. No thyromegaly present.  No carotid bruit  Cardiovascular: Normal rate, regular rhythm and normal heart sounds.   No murmur heard.  No edema. Pulmonary/Chest: Effort normal and breath sounds normal. No respiratory distress. She has no wheezes. She has no rales.  Breast: deferred to Gyn Abdominal: Soft. She exhibits no distension. There is no tenderness.  Lymphadenopathy: She has no cervical adenopathy.  Skin: Skin is warm and dry. She is not diaphoretic.  Psychiatric: She has a normal mood and affect. Her behavior is normal.        Assessment & Plan:   Physical exam: Screening blood work  ordered Immunizations   Up to date, shingrix discussed - rx sent to pharmacy Colonoscopy   Up to date  Mammogram   Up to date  Gyn   Up to date  Dexa: done by gyn - done 2018 Eye exams - due - needs to make an appt EKG  Last done 2014 Exercise  regular Weight  BMI  good Skin  No concerns Substance abuse   none  See Problem List for Assessment and Plan of chronic medical problems.   FU annually

## 2016-08-01 ENCOUNTER — Ambulatory Visit (INDEPENDENT_AMBULATORY_CARE_PROVIDER_SITE_OTHER): Payer: 59 | Admitting: Internal Medicine

## 2016-08-01 ENCOUNTER — Encounter: Payer: Self-pay | Admitting: Internal Medicine

## 2016-08-01 VITALS — BP 120/72 | HR 98 | Temp 98.8°F | Resp 16 | Wt 136.0 lb

## 2016-08-01 DIAGNOSIS — I10 Essential (primary) hypertension: Secondary | ICD-10-CM | POA: Diagnosis not present

## 2016-08-01 DIAGNOSIS — Z87442 Personal history of urinary calculi: Secondary | ICD-10-CM | POA: Diagnosis not present

## 2016-08-01 DIAGNOSIS — Z0000 Encounter for general adult medical examination without abnormal findings: Secondary | ICD-10-CM | POA: Diagnosis not present

## 2016-08-01 DIAGNOSIS — R739 Hyperglycemia, unspecified: Secondary | ICD-10-CM

## 2016-08-01 DIAGNOSIS — E782 Mixed hyperlipidemia: Secondary | ICD-10-CM | POA: Diagnosis not present

## 2016-08-01 DIAGNOSIS — Z8673 Personal history of transient ischemic attack (TIA), and cerebral infarction without residual deficits: Secondary | ICD-10-CM | POA: Diagnosis not present

## 2016-08-01 MED ORDER — LISINOPRIL 40 MG PO TABS: ORAL_TABLET | ORAL | 3 refills | Status: DC

## 2016-08-01 MED ORDER — VITAMIN D 1000 UNITS PO TABS: 1000.0000 [IU] | ORAL_TABLET | Freq: Every day | ORAL | Status: AC

## 2016-08-01 MED ORDER — ZOSTER VAC RECOMB ADJUVANTED 50 MCG/0.5ML IM SUSR: 0.5000 mL | Freq: Once | INTRAMUSCULAR | 1 refills | Status: AC

## 2016-08-01 MED ORDER — DILTIAZEM HCL 90 MG PO TABS: 90.0000 mg | ORAL_TABLET | Freq: Two times a day (BID) | ORAL | 3 refills | Status: DC

## 2016-08-01 MED ORDER — PRAVASTATIN SODIUM 20 MG PO TABS: 20.0000 mg | ORAL_TABLET | Freq: Every day | ORAL | 3 refills | Status: DC

## 2016-08-01 NOTE — Assessment & Plan Note (Signed)
BP well controlled Current regimen effective and well tolerated Continue current medications at current doses  

## 2016-08-01 NOTE — Assessment & Plan Note (Signed)
Check lipid panel  Continue daily statin Regular exercise and healthy diet encouraged  

## 2016-08-01 NOTE — Assessment & Plan Note (Signed)
Check a1c Low sugar / carb diet Doing regular exercise

## 2016-08-01 NOTE — Assessment & Plan Note (Signed)
Mild numbness in hand when tired Taking ASA 81 mg daily BP controlled Continue statin

## 2016-08-09 DIAGNOSIS — N200 Calculus of kidney: Secondary | ICD-10-CM | POA: Diagnosis not present

## 2016-09-01 DIAGNOSIS — Z1231 Encounter for screening mammogram for malignant neoplasm of breast: Secondary | ICD-10-CM | POA: Diagnosis not present

## 2016-09-13 ENCOUNTER — Encounter: Payer: Self-pay | Admitting: Internal Medicine

## 2016-09-13 ENCOUNTER — Other Ambulatory Visit (INDEPENDENT_AMBULATORY_CARE_PROVIDER_SITE_OTHER): Payer: 59

## 2016-09-13 DIAGNOSIS — N200 Calculus of kidney: Secondary | ICD-10-CM | POA: Diagnosis not present

## 2016-09-13 DIAGNOSIS — I10 Essential (primary) hypertension: Secondary | ICD-10-CM | POA: Diagnosis not present

## 2016-09-13 DIAGNOSIS — R739 Hyperglycemia, unspecified: Secondary | ICD-10-CM

## 2016-09-13 DIAGNOSIS — Z0000 Encounter for general adult medical examination without abnormal findings: Secondary | ICD-10-CM

## 2016-09-13 DIAGNOSIS — E782 Mixed hyperlipidemia: Secondary | ICD-10-CM

## 2016-09-13 DIAGNOSIS — R3129 Other microscopic hematuria: Secondary | ICD-10-CM | POA: Diagnosis not present

## 2016-09-13 LAB — LIPID PANEL
CHOL/HDL RATIO: 3
Cholesterol: 183 mg/dL (ref 0–200)
HDL: 68.3 mg/dL (ref >39.00)
LDL CALC: 103 mg/dL — AB (ref 0–99)
NonHDL: 114.97
TRIGLYCERIDES: 62 mg/dL (ref 0.0–149.0)
VLDL: 12.4 mg/dL (ref 0.0–40.0)

## 2016-09-13 LAB — COMPREHENSIVE METABOLIC PANEL
ALT: 14 U/L (ref 0–35)
AST: 14 U/L (ref 0–37)
Albumin: 4.5 g/dL (ref 3.5–5.2)
Alkaline Phosphatase: 47 U/L (ref 39–117)
BILIRUBIN TOTAL: 0.6 mg/dL (ref 0.2–1.2)
BUN: 16 mg/dL (ref 6–23)
CO2: 28 meq/L (ref 19–32)
Calcium: 9.4 mg/dL (ref 8.4–10.5)
Chloride: 105 mEq/L (ref 96–112)
Creatinine, Ser: 0.87 mg/dL (ref 0.40–1.20)
GFR: 72.15 mL/min (ref >60.00)
GLUCOSE: 91 mg/dL (ref 70–99)
POTASSIUM: 4.3 meq/L (ref 3.5–5.1)
SODIUM: 140 meq/L (ref 135–145)
Total Protein: 6.9 g/dL (ref 6.0–8.3)

## 2016-09-13 LAB — HEMOGLOBIN A1C: Hgb A1c MFr Bld: 5.4 % (ref 4.6–6.5)

## 2016-09-13 LAB — TSH: TSH: 1.03 u[IU]/mL (ref 0.35–4.50)

## 2016-12-21 DIAGNOSIS — N200 Calculus of kidney: Secondary | ICD-10-CM | POA: Diagnosis not present

## 2017-03-13 DIAGNOSIS — N200 Calculus of kidney: Secondary | ICD-10-CM | POA: Diagnosis not present

## 2017-04-05 DIAGNOSIS — N200 Calculus of kidney: Secondary | ICD-10-CM | POA: Diagnosis not present

## 2017-04-06 DIAGNOSIS — H353131 Nonexudative age-related macular degeneration, bilateral, early dry stage: Secondary | ICD-10-CM | POA: Diagnosis not present

## 2017-04-26 DIAGNOSIS — Z6829 Body mass index (BMI) 29.0-29.9, adult: Secondary | ICD-10-CM | POA: Diagnosis not present

## 2017-04-26 DIAGNOSIS — Z01419 Encounter for gynecological examination (general) (routine) without abnormal findings: Secondary | ICD-10-CM | POA: Diagnosis not present

## 2017-04-26 DIAGNOSIS — Z1159 Encounter for screening for other viral diseases: Secondary | ICD-10-CM | POA: Diagnosis not present

## 2017-04-26 LAB — HM PAP SMEAR: HM Pap smear: NORMAL

## 2017-05-08 ENCOUNTER — Other Ambulatory Visit: Payer: Self-pay | Admitting: Emergency Medicine

## 2017-05-08 MED ORDER — PRAVASTATIN SODIUM 20 MG PO TABS: 20.0000 mg | ORAL_TABLET | Freq: Every day | ORAL | 0 refills | Status: DC

## 2017-06-01 DIAGNOSIS — N200 Calculus of kidney: Secondary | ICD-10-CM | POA: Diagnosis not present

## 2017-08-08 NOTE — Patient Instructions (Addendum)
Test(s) ordered today. Your results will be released to MyChart (or called to you) after review, usually within 72hours after test completion. If any changes need to be made, you will be notified at that same time.  All other Health Maintenance issues reviewed.   All recommended immunizations and age-appropriate screenings are up-to-date or discussed.  No immunizations administered today.   Medications reviewed and updated.  No changes recommended at this time.    Please followup in one year   Health Maintenance, Female Adopting a healthy lifestyle and getting preventive care can go a long way to promote health and wellness. Talk with your health care provider about what schedule of regular examinations is right for you. This is a good chance for you to check in with your provider about disease prevention and staying healthy. In between checkups, there are plenty of things you can do on your own. Experts have done a lot of research about which lifestyle changes and preventive measures are most likely to keep you healthy. Ask your health care provider for more information. Weight and diet Eat a healthy diet  Be sure to include plenty of vegetables, fruits, low-fat dairy products, and lean protein.  Do not eat a lot of foods high in solid fats, added sugars, or salt.  Get regular exercise. This is one of the most important things you can do for your health. ? Most adults should exercise for at least 150 minutes each week. The exercise should increase your heart rate and make you sweat (moderate-intensity exercise). ? Most adults should also do strengthening exercises at least twice a week. This is in addition to the moderate-intensity exercise.  Maintain a healthy weight  Body mass index (BMI) is a measurement that can be used to identify possible weight problems. It estimates body fat based on height and weight. Your health care provider can help determine your BMI and help you achieve  or maintain a healthy weight.  For females 20 years of age and older: ? A BMI below 18.5 is considered underweight. ? A BMI of 18.5 to 24.9 is normal. ? A BMI of 25 to 29.9 is considered overweight. ? A BMI of 30 and above is considered obese.  Watch levels of cholesterol and blood lipids  You should start having your blood tested for lipids and cholesterol at 55 years of age, then have this test every 5 years.  You may need to have your cholesterol levels checked more often if: ? Your lipid or cholesterol levels are high. ? You are older than 55 years of age. ? You are at high risk for heart disease.  Cancer screening Lung Cancer  Lung cancer screening is recommended for adults 55-80 years old who are at high risk for lung cancer because of a history of smoking.  A yearly low-dose CT scan of the lungs is recommended for people who: ? Currently smoke. ? Have quit within the past 15 years. ? Have at least a 30-pack-year history of smoking. A pack year is smoking an average of one pack of cigarettes a day for 1 year.  Yearly screening should continue until it has been 15 years since you quit.  Yearly screening should stop if you develop a health problem that would prevent you from having lung cancer treatment.  Breast Cancer  Practice breast self-awareness. This means understanding how your breasts normally appear and feel.  It also means doing regular breast self-exams. Let your health care provider know about any   changes, no matter how small.  If you are in your 20s or 30s, you should have a clinical breast exam (CBE) by a health care provider every 1-3 years as part of a regular health exam.  If you are 40 or older, have a CBE every year. Also consider having a breast X-ray (mammogram) every year.  If you have a family history of breast cancer, talk to your health care provider about genetic screening.  If you are at high risk for breast cancer, talk to your health care  provider about having an MRI and a mammogram every year.  Breast cancer gene (BRCA) assessment is recommended for women who have family members with BRCA-related cancers. BRCA-related cancers include: ? Breast. ? Ovarian. ? Tubal. ? Peritoneal cancers.  Results of the assessment will determine the need for genetic counseling and BRCA1 and BRCA2 testing.  Cervical Cancer Your health care provider may recommend that you be screened regularly for cancer of the pelvic organs (ovaries, uterus, and vagina). This screening involves a pelvic examination, including checking for microscopic changes to the surface of your cervix (Pap test). You may be encouraged to have this screening done every 3 years, beginning at age 21.  For women ages 30-65, health care providers may recommend pelvic exams and Pap testing every 3 years, or they may recommend the Pap and pelvic exam, combined with testing for human papilloma virus (HPV), every 5 years. Some types of HPV increase your risk of cervical cancer. Testing for HPV may also be done on women of any age with unclear Pap test results.  Other health care providers may not recommend any screening for nonpregnant women who are considered low risk for pelvic cancer and who do not have symptoms. Ask your health care provider if a screening pelvic exam is right for you.  If you have had past treatment for cervical cancer or a condition that could lead to cancer, you need Pap tests and screening for cancer for at least 20 years after your treatment. If Pap tests have been discontinued, your risk factors (such as having a new sexual partner) need to be reassessed to determine if screening should resume. Some women have medical problems that increase the chance of getting cervical cancer. In these cases, your health care provider may recommend more frequent screening and Pap tests.  Colorectal Cancer  This type of cancer can be detected and often prevented.  Routine  colorectal cancer screening usually begins at 55 years of age and continues through 55 years of age.  Your health care provider may recommend screening at an earlier age if you have risk factors for colon cancer.  Your health care provider may also recommend using home test kits to check for hidden blood in the stool.  A small camera at the end of a tube can be used to examine your colon directly (sigmoidoscopy or colonoscopy). This is done to check for the earliest forms of colorectal cancer.  Routine screening usually begins at age 50.  Direct examination of the colon should be repeated every 5-10 years through 55 years of age. However, you may need to be screened more often if early forms of precancerous polyps or small growths are found.  Skin Cancer  Check your skin from head to toe regularly.  Tell your health care provider about any new moles or changes in moles, especially if there is a change in a mole's shape or color.  Also tell your health care provider if   you have a mole that is larger than the size of a pencil eraser.  Always use sunscreen. Apply sunscreen liberally and repeatedly throughout the day.  Protect yourself by wearing long sleeves, pants, a wide-brimmed hat, and sunglasses whenever you are outside.  Heart disease, diabetes, and high blood pressure  High blood pressure causes heart disease and increases the risk of stroke. High blood pressure is more likely to develop in: ? People who have blood pressure in the high end of the normal range (130-139/85-89 mm Hg). ? People who are overweight or obese. ? People who are African American.  If you are 55-1 years of age, have your blood pressure checked every 3-5 years. If you are 86 years of age or older, have your blood pressure checked every year. You should have your blood pressure measured twice-once when you are at a hospital or clinic, and once when you are not at a hospital or clinic. Record the average of the  two measurements. To check your blood pressure when you are not at a hospital or clinic, you can use: ? An automated blood pressure machine at a pharmacy. ? A home blood pressure monitor.  If you are between 15 years and 81 years old, ask your health care provider if you should take aspirin to prevent strokes.  Have regular diabetes screenings. This involves taking a blood sample to check your fasting blood sugar level. ? If you are at a normal weight and have a low risk for diabetes, have this test once every three years after 55 years of age. ? If you are overweight and have a high risk for diabetes, consider being tested at a younger age or more often. Preventing infection Hepatitis B  If you have a higher risk for hepatitis B, you should be screened for this virus. You are considered at high risk for hepatitis B if: ? You were born in a country where hepatitis B is common. Ask your health care provider which countries are considered high risk. ? Your parents were born in a high-risk country, and you have not been immunized against hepatitis B (hepatitis B vaccine). ? You have HIV or AIDS. ? You use needles to inject street drugs. ? You live with someone who has hepatitis B. ? You have had sex with someone who has hepatitis B. ? You get hemodialysis treatment. ? You take certain medicines for conditions, including cancer, organ transplantation, and autoimmune conditions.  Hepatitis C  Blood testing is recommended for: ? Everyone born from [birth year] through 1963. ? Anyone with known risk factors for hepatitis C.  Sexually transmitted infections (STIs)  You should be screened for sexually transmitted infections (STIs) including gonorrhea and chlamydia if: ? You are sexually active and are younger than 55 years of age. ? You are older than 55 years of age and your health care provider tells you that you are at risk for this type of infection. ? Your sexual activity has changed since you  were last screened and you are at an increased risk for chlamydia or gonorrhea. Ask your health care provider if you are at risk.  If you do not have HIV, but are at risk, it may be recommended that you take a prescription medicine daily to prevent HIV infection. This is called pre-exposure prophylaxis (PrEP). You are considered at risk if: ? You are sexually active and do not regularly use condoms or know the HIV status of your partner(s). ? You take drugs by  injection. ? You are sexually active with a partner who has HIV.  Talk with your health care provider about whether you are at high risk of being infected with HIV. If you choose to begin PrEP, you should first be tested for HIV. You should then be tested every 3 months for as long as you are taking PrEP. Pregnancy  If you are premenopausal and you may become pregnant, ask your health care provider about preconception counseling.  If you may become pregnant, take 400 to 800 micrograms (mcg) of folic acid every day.  If you want to prevent pregnancy, talk to your health care provider about birth control (contraception). Osteoporosis and menopause  Osteoporosis is a disease in which the bones lose minerals and strength with aging. This can result in serious bone fractures. Your risk for osteoporosis can be identified using a bone density scan.  If you are 69 years of age or older, or if you are at risk for osteoporosis and fractures, ask your health care provider if you should be screened.  Ask your health care provider whether you should take a calcium or vitamin D supplement to lower your risk for osteoporosis.  Menopause may have certain physical symptoms and risks.  Hormone replacement therapy may reduce some of these symptoms and risks. Talk to your health care provider about whether hormone replacement therapy is right for you. Follow these instructions at home:  Schedule regular health, dental, and eye exams.  Stay current  with your immunizations.  Do not use any tobacco products including cigarettes, chewing tobacco, or electronic cigarettes.  If you are pregnant, do not drink alcohol.  If you are breastfeeding, limit how much and how often you drink alcohol.  Limit alcohol intake to no more than 1 drink per day for nonpregnant women. One drink equals 12 ounces of beer, 5 ounces of wine, or 1 ounces of hard liquor.  Do not use street drugs.  Do not share needles.  Ask your health care provider for help if you need support or information about quitting drugs.  Tell your health care provider if you often feel depressed.  Tell your health care provider if you have ever been abused or do not feel safe at home. This information is not intended to replace advice given to you by your health care provider. Make sure you discuss any questions you have with your health care provider. Document Released: 08/23/2010 Document Revised: 07/16/2015 Document Reviewed: 11/11/2014 Elsevier Interactive Patient Education  [employer].

## 2017-08-08 NOTE — Progress Notes (Signed)
Subjective:    Patient ID: Brenda Gomez, female    DOB: 29-Jun-1960, 57 y.o.   MRN: [id number]  HPI She is here for a physical exam.   She was placed on potassium citrate by urology for prevention of kidney stones.  She denies other changes in her health.  She has no concerns.   She is exercising regularly.    Medications and allergies reviewed with patient and updated if appropriate.  Patient Active Problem List   Diagnosis Date Noted  . Genetic testing 05/30/2016  . Family history of Lynch syndrome 05/09/2016  . Family history of colon cancer   . Family history of kidney cancer   . History of CVA (cerebrovascular accident) 12/02/2008  . PARESTHESIA 08/20/2008  . Hyperglycemia 08/20/2008  . Essential hypertension 08/16/2007  . NEPHROLITHIASIS, HX OF 08/16/2007  . HYPERLIPIDEMIA 04/06/2007    Current Outpatient Medications on File Prior to Visit  Medication Sig Dispense Refill  . alendronate (FOSAMAX) 70 MG tablet Take 70 mg by mouth once a week. Take with a full glass of water on an empty stomach.    Marland Kitchen aspirin 81 MG tablet Take 1 tablet (81 mg total) by mouth daily. 30 tablet   . Calcium Carbonate (CALTRATE 600 PO) Take by mouth. Reported on 06/04/2015    . cetirizine (ZYRTEC) 10 MG tablet Take 10 mg by mouth every evening.    . cholecalciferol (VITAMIN D) 1000 units tablet Take 1 tablet (1,000 Units total) by mouth daily.    Marland Kitchen diltiazem (CARDIZEM) 90 MG tablet Take 1 tablet (90 mg total) by mouth 2 (two) times daily. 180 tablet 3  . fish oil-omega-3 fatty acids 1000 MG capsule Take 1 g by mouth 2 (two) times daily.     Marland Kitchen gabapentin (NEURONTIN) 100 MG capsule Take 200 mg by mouth at bedtime.  5  . lisinopril (PRINIVIL,ZESTRIL) 40 MG tablet Take 1/2 tablet twice daily 90 tablet 3  . Multiple Vitamin (MULITIVITAMIN WITH MINERALS) TABS Take 1 tablet by mouth daily.    . pravastatin (PRAVACHOL) 20 MG tablet Take 1 tablet (20 mg total) by mouth at bedtime. -- Office visit  needed for further refills 90 tablet 0  . Potassium Citrate 15 MEQ (1620 MG) TBCR Take 1 tablet by mouth 2 (two) times daily.  11   No current facility-administered medications on file prior to visit.     Past Medical History:  Diagnosis Date  . Allergic rhinitis   . Allergy   . Blood transfusion without reported diagnosis   . Dyslipidemia   . Family history of colon cancer   . Family history of kidney cancer   . Family history of Lynch syndrome   . Hand tingling left hand , occasional  . Headache(784.0)    Dr Jannifer Franklin Rxed Nortriptyline  . History of cerebral embolism with infarction-  3 yrs ago,  age 66   residual left hand occasional numb/ tingle-- NEUROLOGIST  DR WILLIS-- LAST NOTE 02-25-2009 W/ CHART  . History of cerebrovascular accident with residual effects   . History of kidney stones    Dr Junious Silk  . Hypertension   . PFO (patent foramen ovale) small- per Dr  Jannifer Franklin, positive transcranial bubble study, and tee echo  . Renal cyst left  . Stroke [facility]) 2010    Past Surgical History:  Procedure Laterality Date  . APPENDECTOMY  2003  . CARDIOVASCULAR STRESS TEST  2004   NORMAL  . CYSTOSCOPY W/ RETROGRADES  03/24/2011  Procedure: CYSTOSCOPY WITH RETROGRADE PYELOGRAM;  Surgeon: Fredricka Bonine, MD;  Location: [facility];  Service: Urology;  Laterality: Right;  RT RPG, RT URETERAL STENT, BLADDER BIOPSY AND FULGERATION   . CYSTOSCOPY WITH RETROGRADE PYELOGRAM, URETEROSCOPY AND STENT PLACEMENT Right 07/11/2016   Procedure: CYSTOSCOPY, URETEROSCOPY AND RIGHT STENT PLACEMENT;  Surgeon: Irine Seal, MD;  Location: [facility];  Service: Urology;  Laterality: Right;  . [place] X2  , 2013   LEFT X 2; R X 1. Dr Junious Silk, [facility]  . NEPHROLITHOTOMY  1998   LEFT KIDNEY STONE  . TRANSCRANIAL DOPPLER  11-20-2008   POSITIVE BUBBLE STUDY - SMALL INTRACARDIAC RIGHT TO LEFT SHUNT  . TRANSESOPHAGEAL ECHOCARDIOGRAM  01-19-2009     SMALL PFO/ NO SIGNIFICANT VALVULAR DISEASE/ EF 60-65%  . WISDOM TOOTH EXTRACTION      Social History   Socioeconomic History  . Marital status: Married    Spouse name: Not on file  . Number of children: Not on file  . Years of education: Not on file  . Highest education level: Not on file  Occupational History  . Not on file  Social Needs  . Financial resource strain: Not on file  . Food insecurity:    Worry: Not on file    Inability: Not on file  . Transportation needs:    Medical: Not on file    Non-medical: Not on file  Tobacco Use  . Smoking status: Former Smoker    Packs/day: 0.25    Years: 20.00    Pack years: 5.00    Types: Cigarettes    Last attempt to quit: 03/22/2010    Years since quitting: 7.3  . Smokeless tobacco: Never Used  . Tobacco comment: smoked 1980- 2012, up to 1 pp WEEK  Substance and Sexual Activity  . Alcohol use: No    Alcohol/week: 0.0 oz  . Drug use: No  . Sexual activity: Not on file  Lifestyle  . Physical activity:    Days per week: Not on file    Minutes per session: Not on file  . Stress: Not on file  Relationships  . Social connections:    Talks on phone: Not on file    Gets together: Not on file    Attends religious service: Not on file    Active member of club or organization: Not on file    Attends meetings of clubs or organizations: Not on file    Relationship status: Not on file  Other Topics Concern  . Not on file  Social History Narrative   Exercise: doing Pur bar    Family History  Problem Relation Age of Onset  . Diabetes Mother   . Stroke Mother 33  . Nephrolithiasis Mother   . Cancer Sister 8       sarcoma; maternal half sister  . Diabetes Maternal Aunt   . Kidney cancer Maternal Aunt        dx in her 68s  . Diabetes Maternal Grandmother   . Heart attack Maternal Grandmother        > 65  . Cancer Maternal Grandfather        melanoma with pulmonary mets  . Brain cancer Other        MGF's sister  .  Colon cancer Brother 79       maternal half brother  . Lung cancer Paternal Uncle        smoker  . Lymphoma  Paternal Grandmother        hodgkins  . Heart attack Paternal Grandfather   . Colon cancer Cousin        maternal first cousin dx in her 33s  . Cancer Other        NOS; MGFs brother  . Colon polyps Neg Hx   . Esophageal cancer Neg Hx   . Rectal cancer Neg Hx   . Stomach cancer Neg Hx     Review of Systems  Constitutional: Negative for chills, fatigue and fever.  Eyes: Negative for visual disturbance.  Respiratory: Negative for shortness of breath and wheezing.   Cardiovascular: Negative for chest pain, palpitations and leg swelling.  Gastrointestinal: Negative for abdominal pain, blood in stool, constipation, diarrhea and nausea.       No gerd  Genitourinary: Negative for dysuria and hematuria.  Musculoskeletal: Positive for arthralgias (left hip). Negative for back pain.  Skin: Negative for color change and rash.  Neurological: Positive for headaches (chronic). Negative for dizziness and light-headedness.  Psychiatric/Behavioral: Negative for dysphoric mood and sleep disturbance. The patient is not nervous/anxious.        Objective:   Vitals:   08/09/17 0809  BP: 110/80  Pulse: 77  Resp: 16  Temp: 98.2 F (36.8 C)  SpO2: 96%   Filed Weights   08/09/17 0809  Weight: 136 lb (61.7 kg)   Body mass index is 26.56 kg/m.  Wt Readings from Last 3 Encounters:  08/09/17 136 lb (61.7 kg)  08/01/16 136 lb (61.7 kg)  07/11/16 133 lb 4 oz (60.4 kg)     Physical Exam Constitutional: She appears well-developed and well-nourished. No distress.  HENT:  Head: Normocephalic and atraumatic.  Right Ear: External ear normal. Normal ear canal and TM Left Ear: External ear normal.  Normal ear canal and TM Mouth/Throat: Oropharynx is clear and moist.  Eyes: Conjunctivae and EOM are normal.  Neck: Neck supple. No tracheal deviation present. No thyromegaly present.  No  carotid bruit  Cardiovascular: Normal rate, regular rhythm and normal heart sounds.   No murmur heard.  No edema. Pulmonary/Chest: Effort normal and breath sounds normal. No respiratory distress. She has no wheezes. She has no rales.  Breast: deferred to Gyn Abdominal: Soft. She exhibits no distension. There is no tenderness.  Lymphadenopathy: She has no cervical adenopathy.  Skin: Skin is warm and dry. She is not diaphoretic.  Psychiatric: She has a normal mood and affect. Her behavior is normal.        Assessment & Plan:   Physical exam: Screening blood work    ordered Immunizations  Discussed shingrix, others up to date Colonoscopy    Up to date  Mammogram  Up to date  - done 09/05/16 Gyn   Up to date  - Dr Radene Knee Dexa   Done in 2018 by GYN Eye exams  Up to date  EKG      Done 06/2016 Exercise   Regular exercise - walk, classes, weights Weight   Normal weight Skin  No concerns Substance abuse  none  See Problem List for Assessment and Plan of chronic medical problems.    FU in one year

## 2017-08-09 ENCOUNTER — Other Ambulatory Visit (INDEPENDENT_AMBULATORY_CARE_PROVIDER_SITE_OTHER): Payer: 59

## 2017-08-09 ENCOUNTER — Encounter: Payer: Self-pay | Admitting: Internal Medicine

## 2017-08-09 ENCOUNTER — Ambulatory Visit (INDEPENDENT_AMBULATORY_CARE_PROVIDER_SITE_OTHER): Payer: 59 | Admitting: Internal Medicine

## 2017-08-09 VITALS — BP 110/80 | HR 77 | Temp 98.2°F | Resp 16 | Ht 60.0 in | Wt 136.0 lb

## 2017-08-09 DIAGNOSIS — Z0000 Encounter for general adult medical examination without abnormal findings: Secondary | ICD-10-CM

## 2017-08-09 DIAGNOSIS — R739 Hyperglycemia, unspecified: Secondary | ICD-10-CM

## 2017-08-09 DIAGNOSIS — E782 Mixed hyperlipidemia: Secondary | ICD-10-CM | POA: Diagnosis not present

## 2017-08-09 DIAGNOSIS — Z87442 Personal history of urinary calculi: Secondary | ICD-10-CM | POA: Diagnosis not present

## 2017-08-09 DIAGNOSIS — I10 Essential (primary) hypertension: Secondary | ICD-10-CM | POA: Diagnosis not present

## 2017-08-09 LAB — LIPID PANEL
CHOL/HDL RATIO: 3
Cholesterol: 210 mg/dL — ABNORMAL HIGH (ref 0–200)
HDL: 71.9 mg/dL (ref >39.00)
LDL Cholesterol: 124 mg/dL — ABNORMAL HIGH (ref 0–99)
NonHDL: 137.64
Triglycerides: 70 mg/dL (ref 0.0–149.0)
VLDL: 14 mg/dL (ref 0.0–40.0)

## 2017-08-09 LAB — COMPREHENSIVE METABOLIC PANEL
ALT: 18 U/L (ref 0–35)
AST: 14 U/L (ref 0–37)
Albumin: 4.7 g/dL (ref 3.5–5.2)
Alkaline Phosphatase: 46 U/L (ref 39–117)
BILIRUBIN TOTAL: 0.6 mg/dL (ref 0.2–1.2)
BUN: 18 mg/dL (ref 6–23)
CO2: 30 meq/L (ref 19–32)
Calcium: 9.6 mg/dL (ref 8.4–10.5)
Chloride: 101 mEq/L (ref 96–112)
Creatinine, Ser: 0.77 mg/dL (ref 0.40–1.20)
GFR: 82.79 mL/min (ref >60.00)
GLUCOSE: 87 mg/dL (ref 70–99)
Potassium: 4.3 mEq/L (ref 3.5–5.1)
Sodium: 138 mEq/L (ref 135–145)
Total Protein: 7.4 g/dL (ref 6.0–8.3)

## 2017-08-09 LAB — CBC WITH DIFFERENTIAL/PLATELET
BASOS ABS: 0 10*3/uL (ref 0.0–0.1)
Basophils Relative: 0.7 % (ref 0.0–3.0)
EOS PCT: 2.5 % (ref 0.0–5.0)
Eosinophils Absolute: 0.1 10*3/uL (ref 0.0–0.7)
HCT: 41.2 % (ref 36.0–46.0)
Hemoglobin: 14.4 g/dL (ref 12.0–15.0)
LYMPHS ABS: 1.9 10*3/uL (ref 0.7–4.0)
Lymphocytes Relative: 43.9 % (ref 12.0–46.0)
MCHC: 34.8 g/dL (ref 30.0–36.0)
MCV: 87.1 fl (ref 78.0–100.0)
MONOS PCT: 8.9 % (ref 3.0–12.0)
Monocytes Absolute: 0.4 10*3/uL (ref 0.1–1.0)
NEUTROS PCT: 44 % (ref 43.0–77.0)
Neutro Abs: 1.9 10*3/uL (ref 1.4–7.7)
PLATELETS: 269 10*3/uL (ref 150.0–400.0)
RBC: 4.74 Mil/uL (ref 3.87–5.11)
RDW: 12.4 % (ref 11.5–15.5)
WBC: 4.3 10*3/uL (ref 4.0–10.5)

## 2017-08-09 LAB — HEMOGLOBIN A1C: HEMOGLOBIN A1C: 5.7 % (ref 4.6–6.5)

## 2017-08-09 LAB — TSH: TSH: 0.98 u[IU]/mL (ref 0.35–4.50)

## 2017-08-09 MED ORDER — PRAVASTATIN SODIUM 20 MG PO TABS: 20.0000 mg | ORAL_TABLET | Freq: Every day | ORAL | 3 refills | Status: DC

## 2017-08-09 MED ORDER — LISINOPRIL 40 MG PO TABS: ORAL_TABLET | ORAL | 3 refills | Status: DC

## 2017-08-09 MED ORDER — DILTIAZEM HCL 90 MG PO TABS: 90.0000 mg | ORAL_TABLET | Freq: Two times a day (BID) | ORAL | 3 refills | Status: DC

## 2017-08-09 NOTE — Assessment & Plan Note (Signed)
Following urology Taking potassium citrate

## 2017-08-09 NOTE — Assessment & Plan Note (Signed)
Check lipid panel  Continue daily statin Regular exercise and healthy diet encouraged  

## 2017-08-09 NOTE — Assessment & Plan Note (Signed)
BP well controlled Current regimen effective and well tolerated Continue current medications at current doses cmp  

## 2017-08-09 NOTE — Assessment & Plan Note (Signed)
Family history of diabetes Check a1c Low sugar / carb diet Stressed regular exercise   

## 2017-08-22 ENCOUNTER — Encounter: Payer: Self-pay | Admitting: Internal Medicine

## 2017-09-06 DIAGNOSIS — Z1231 Encounter for screening mammogram for malignant neoplasm of breast: Secondary | ICD-10-CM | POA: Diagnosis not present

## 2017-09-06 LAB — HM MAMMOGRAPHY

## 2017-09-14 ENCOUNTER — Telehealth: Payer: Self-pay

## 2017-09-14 NOTE — Telephone Encounter (Signed)
Left message asking patient to call back to schedule nurse visit to get first shingrix vaccine--let Monterio Bob,RN at [facility] office know when appt is made so that both vaccines can be labeled

## 2018-01-23 ENCOUNTER — Encounter: Payer: Self-pay | Admitting: Internal Medicine

## 2018-01-23 ENCOUNTER — Ambulatory Visit (INDEPENDENT_AMBULATORY_CARE_PROVIDER_SITE_OTHER): Payer: 59 | Admitting: Internal Medicine

## 2018-01-23 VITALS — BP 120/72 | HR 73 | Temp 98.2°F | Resp 16 | Ht 60.0 in | Wt 134.8 lb

## 2018-01-23 DIAGNOSIS — J0100 Acute maxillary sinusitis, unspecified: Secondary | ICD-10-CM

## 2018-01-23 MED ORDER — PROMETHAZINE-DM 6.25-15 MG/5ML PO SYRP: 5.0000 mL | ORAL_SOLUTION | Freq: Four times a day (QID) | ORAL | 0 refills | Status: DC | PRN

## 2018-01-23 MED ORDER — METHYLPREDNISOLONE 4 MG PO TBPK: ORAL_TABLET | ORAL | 0 refills | Status: DC

## 2018-01-23 NOTE — Patient Instructions (Signed)
Take the steroid pak and use the cough syrup as needed.  Take over the counter cold medications as needed.  Rest, fluids.  Call if no improvement      Sinusitis, Adult Sinusitis is soreness and inflammation of your sinuses. Sinuses are hollow spaces in the bones around your face. Your sinuses are located:  Around your eyes.  In the middle of your forehead.  Behind your nose.  In your cheekbones.  Your sinuses and nasal passages are lined with a stringy fluid (mucus). Mucus normally drains out of your sinuses. When your nasal tissues become inflamed or swollen, the mucus can become trapped or blocked so air cannot flow through your sinuses. This allows bacteria, viruses, and funguses to grow, which leads to infection. Sinusitis can develop quickly and last for 7?10 days (acute) or for more than 12 weeks (chronic). Sinusitis often develops after a cold. What are the causes? This condition is caused by anything that creates swelling in the sinuses or stops mucus from draining, including:  Allergies.  Asthma.  Bacterial or viral infection.  Abnormally shaped bones between the nasal passages.  Nasal growths that contain mucus (nasal polyps).  Narrow sinus openings.  Pollutants, such as chemicals or irritants in the air.  A foreign object stuck in the nose.  A fungal infection. This is rare.  What increases the risk? The following factors may make you more likely to develop this condition:  Having allergies or asthma.  Having had a recent cold or respiratory tract infection.  Having structural deformities or blockages in your nose or sinuses.  Having a weak immune system.  Doing a lot of swimming or diving.  Overusing nasal sprays.  Smoking.  What are the signs or symptoms? The main symptoms of this condition are pain and a feeling of pressure around the affected sinuses. Other symptoms include:  Upper toothache.  Earache.  Headache.  Bad  breath.  Decreased sense of smell and taste.  A cough that may get worse at night.  Fatigue.  Fever.  Thick drainage from your nose. The drainage is often green and it may contain pus (purulent).  Stuffy nose or congestion.  Postnasal drip. This is when extra mucus collects in the throat or back of the nose.  Swelling and warmth over the affected sinuses.  Sore throat.  Sensitivity to light.  How is this diagnosed? This condition is diagnosed based on symptoms, a medical history, and a physical exam. To find out if your condition is acute or chronic, your health care provider may:  Look in your nose for signs of nasal polyps.  Tap over the affected sinus to check for signs of infection.  View the inside of your sinuses using an imaging device that has a light attached (endoscope).  If your health care provider suspects that you have chronic sinusitis, you may also:  Be tested for allergies.  Have a sample of mucus taken from your nose (nasal culture) and checked for bacteria.  Have a mucus sample examined to see if your sinusitis is related to an allergy.  If your sinusitis does not respond to treatment and it lasts longer than 8 weeks, you may have an MRI or CT scan to check your sinuses. These scans also help to determine how severe your infection is. In rare cases, a bone biopsy may be done to rule out more serious types of fungal sinus disease. How is this treated? Treatment for sinusitis depends on the cause and whether  your condition is chronic or acute. If a virus is causing your sinusitis, your symptoms will go away on their own within 10 days. You may be given medicines to relieve your symptoms, including:  Topical nasal decongestants. They shrink swollen nasal passages and let mucus drain from your sinuses.  Antihistamines. These drugs block inflammation that is triggered by allergies. This can help to ease swelling in your nose and sinuses.  Topical nasal  corticosteroids. These are nasal sprays that ease inflammation and swelling in your nose and sinuses.  Nasal saline washes. These rinses can help to get rid of thick mucus in your nose.  If your condition is caused by bacteria, you will be given an antibiotic medicine. If your condition is caused by a fungus, you will be given an antifungal medicine. Surgery may be needed to correct underlying conditions, such as narrow nasal passages. Surgery may also be needed to remove polyps. Follow these instructions at home: Medicines  Take, use, or apply over-the-counter and prescription medicines only as told by your health care provider. These may include nasal sprays.  If you were prescribed an antibiotic medicine, take it as told by your health care provider. Do not stop taking the antibiotic even if you start to feel better. Hydrate and Humidify  Drink enough water to keep your urine clear or pale yellow. Staying hydrated will help to thin your mucus.  Use a cool mist humidifier to keep the humidity level in your home above 50%.  Inhale steam for 10-15 minutes, 3-4 times a day or as told by your health care provider. You can do this in the bathroom while a hot shower is running.  Limit your exposure to cool or dry air. Rest  Rest as much as possible.  Sleep with your head raised (elevated).  Make sure to get enough sleep each night. General instructions  Apply a warm, moist washcloth to your face 3-4 times a day or as told by your health care provider. This will help with discomfort.  Wash your hands often with soap and water to reduce your exposure to viruses and other germs. If soap and water are not available, use hand sanitizer.  Do not smoke. Avoid being around people who are smoking (secondhand smoke).  Keep all follow-up visits as told by your health care provider. This is important. Contact a health care provider if:  You have a fever.  Your symptoms get worse.  Your  symptoms do not improve within 10 days. Get help right away if:  You have a severe headache.  You have persistent vomiting.  You have pain or swelling around your face or eyes.  You have vision problems.  You develop confusion.  Your neck is stiff.  You have trouble breathing. This information is not intended to replace advice given to you by your health care provider. Make sure you discuss any questions you have with your health care provider. Document Released: 02/07/2005 Document Revised: 10/04/2015 Document Reviewed: 12/03/2014 Elsevier Interactive Patient Education  [employer].

## 2018-01-23 NOTE — Assessment & Plan Note (Signed)
Likely viral in nature Promethazine-dextromethorphan cough syrup Medrol dose pack Discussed otc cold meds Rest, fluids Call if no improvement

## 2018-01-23 NOTE — Progress Notes (Signed)
Subjective:    Patient ID: Brenda Gomez, female    DOB: 11/23/1960, 57 y.o.   MRN: [id number]  HPI She is here for an acute visit for cold symptoms.  Her symptoms started 4 days ago.    She is experiencing chills today, nasal congestion, sinus pressure around her eyes, sneezing, sore throat, dry cough and headaches.  She denies any fever, ear pain, shortness of breath, wheeze, nausea, diarrhea, body aches, lightheadedness and dizziness.  She has taken allegra, tylenol and old prescription cough syrup  Medications and allergies reviewed with patient and updated if appropriate.  Patient Active Problem List   Diagnosis Date Noted  . Genetic testing 05/30/2016  . Family history of Lynch syndrome 05/09/2016  . Family history of colon cancer   . Family history of kidney cancer   . History of CVA (cerebrovascular accident) 12/02/2008  . PARESTHESIA 08/20/2008  . Prediabetes 08/20/2008  . Essential hypertension 08/16/2007  . NEPHROLITHIASIS, HX OF 08/16/2007  . HYPERLIPIDEMIA 04/06/2007    Current Outpatient Medications on File Prior to Visit  Medication Sig Dispense Refill  . alendronate (FOSAMAX) 70 MG tablet Take 70 mg by mouth once a week. Take with a full glass of water on an empty stomach.    Marland Kitchen aspirin 81 MG tablet Take 1 tablet (81 mg total) by mouth daily. 30 tablet   . Calcium Carbonate (CALTRATE 600 PO) Take by mouth. Reported on 06/04/2015    . cetirizine (ZYRTEC) 10 MG tablet Take 10 mg by mouth every evening.    . cholecalciferol (VITAMIN D) 1000 units tablet Take 1 tablet (1,000 Units total) by mouth daily.    Marland Kitchen diltiazem (CARDIZEM) 90 MG tablet Take 1 tablet (90 mg total) by mouth 2 (two) times daily. 180 tablet 3  . fish oil-omega-3 fatty acids 1000 MG capsule Take 1 g by mouth 2 (two) times daily.     Marland Kitchen gabapentin (NEURONTIN) 100 MG capsule Take 200 mg by mouth at bedtime.  5  . lisinopril (PRINIVIL,ZESTRIL) 40 MG tablet Take 1/2 tablet twice daily 90 tablet 3    . Multiple Vitamin (MULITIVITAMIN WITH MINERALS) TABS Take 1 tablet by mouth daily.    . Potassium Citrate 15 MEQ (1620 MG) TBCR Take 1 tablet by mouth 2 (two) times daily.  11  . pravastatin (PRAVACHOL) 20 MG tablet Take 1 tablet (20 mg total) by mouth at bedtime. 90 tablet 3   No current facility-administered medications on file prior to visit.     Past Medical History:  Diagnosis Date  . Allergic rhinitis   . Allergy   . Blood transfusion without reported diagnosis   . Dyslipidemia   . Family history of colon cancer   . Family history of kidney cancer   . Family history of Lynch syndrome   . Hand tingling left hand , occasional  . Headache(784.0)    Dr Jannifer Franklin Rxed Nortriptyline  . History of cerebral embolism with infarction-  3 yrs ago,  age 69   residual left hand occasional numb/ tingle-- NEUROLOGIST  DR WILLIS-- LAST NOTE 02-25-2009 W/ CHART  . History of cerebrovascular accident with residual effects   . History of kidney stones    Dr Junious Silk  . Hypertension   . PFO (patent foramen ovale) small- per Dr  Jannifer Franklin, positive transcranial bubble study, and tee echo  . Renal cyst left  . Stroke [facility]) 2010    Past Surgical History:  Procedure Laterality Date  . APPENDECTOMY  2003  . CARDIOVASCULAR STRESS TEST  2004   NORMAL  . CYSTOSCOPY W/ RETROGRADES  03/24/2011   Procedure: CYSTOSCOPY WITH RETROGRADE PYELOGRAM;  Surgeon: Fredricka Bonine, MD;  Location[facility];  Service: Urology;  Laterality: Right;  RT RPG, RT URETERAL STENT, BLADDER BIOPSY AND FULGERATION   . CYSTOSCOPY WITH RETROGRADE PYELOGRAM, URETEROSCOPY AND STENT PLACEMENT Right 07/11/2016   Procedure: CYSTOSCOPY, URETEROSCOPY AND RIGHT STENT PLACEMENT;  Surgeon: Irine Seal, MD;  Location: [facility];  Service: Urology;  Laterality: Right;  . [place] X2  , 2013   LEFT X 2; R X 1. Dr Junious Silk, [facility]  . NEPHROLITHOTOMY  1998   LEFT KIDNEY STONE   . TRANSCRANIAL DOPPLER  11-20-2008   POSITIVE BUBBLE STUDY - SMALL INTRACARDIAC RIGHT TO LEFT SHUNT  . TRANSESOPHAGEAL ECHOCARDIOGRAM  01-19-2009   SMALL PFO/ NO SIGNIFICANT VALVULAR DISEASE/ EF 60-65%  . WISDOM TOOTH EXTRACTION      Social History   Socioeconomic History  . Marital status: Married    Spouse name: Not on file  . Number of children: Not on file  . Years of education: Not on file  . Highest education level: Not on file  Occupational History  . Not on file  Social Needs  . Financial resource strain: Not on file  . Food insecurity:    Worry: Not on file    Inability: Not on file  . Transportation needs:    Medical: Not on file    Non-medical: Not on file  Tobacco Use  . Smoking status: Former Smoker    Packs/day: 0.25    Years: 20.00    Pack years: 5.00    Types: Cigarettes    Last attempt to quit: 03/22/2010    Years since quitting: 7.8  . Smokeless tobacco: Never Used  . Tobacco comment: smoked 1980- 2012, up to 1 pp WEEK  Substance and Sexual Activity  . Alcohol use: No    Alcohol/week: 0.0 standard drinks  . Drug use: No  . Sexual activity: Not on file  Lifestyle  . Physical activity:    Days per week: Not on file    Minutes per session: Not on file  . Stress: Not on file  Relationships  . Social connections:    Talks on phone: Not on file    Gets together: Not on file    Attends religious service: Not on file    Active member of club or organization: Not on file    Attends meetings of clubs or organizations: Not on file    Relationship status: Not on file  Other Topics Concern  . Not on file  Social History Narrative   Exercise: doing Pur bar    Family History  Problem Relation Age of Onset  . Diabetes Mother   . Stroke Mother 4  . Nephrolithiasis Mother   . Cancer Sister 8       sarcoma; maternal half sister  . Diabetes Maternal Aunt   . Kidney cancer Maternal Aunt        dx in her 59s  . Diabetes Maternal Grandmother   .  Heart attack Maternal Grandmother        > 65  . Cancer Maternal Grandfather        melanoma with pulmonary mets  . Brain cancer Other        MGF's sister  . Colon cancer Brother 74  maternal half brother  . Lung cancer Paternal Uncle        smoker  . Lymphoma Paternal Grandmother        hodgkins  . Heart attack Paternal Grandfather   . Colon cancer Cousin        maternal first cousin dx in her 48s  . Cancer Other        NOS; MGFs brother  . Colon polyps Neg Hx   . Esophageal cancer Neg Hx   . Rectal cancer Neg Hx   . Stomach cancer Neg Hx     Review of Systems  Constitutional: Positive for chills (today). Negative for fever.  HENT: Positive for congestion, sinus pain, sneezing and sore throat. Negative for ear pain.   Respiratory: Positive for cough (dry). Negative for shortness of breath and wheezing.   Gastrointestinal: Negative for diarrhea and nausea.  Musculoskeletal: Negative for myalgias.  Neurological: Positive for headaches. Negative for dizziness and light-headedness.       Objective:   Vitals:   01/23/18 1311  BP: 120/72  Pulse: 73  Resp: 16  Temp: 98.2 F (36.8 C)  SpO2: 97%   Filed Weights   01/23/18 1311  Weight: 134 lb 12.8 oz (61.1 kg)   Body mass index is 26.33 kg/m.  Wt Readings from Last 3 Encounters:  01/23/18 134 lb 12.8 oz (61.1 kg)  08/09/17 136 lb (61.7 kg)  08/01/16 136 lb (61.7 kg)     Physical Exam GENERAL APPEARANCE: Appears stated age, well appearing, NAD EYES: conjunctiva clear, no icterus HEENT: bilateral tympanic membranes and ear canals normal, oropharynx with mild erythema, no thyromegaly, trachea midline, no cervical or supraclavicular lymphadenopathy LUNGS: Clear to auscultation without wheeze or crackles, unlabored breathing, good air entry bilaterally CARDIOVASCULAR: Normal S1,S2 without murmurs, no edema SKIN: warm, dry        Assessment & Plan:   See Problem List for Assessment and Plan of chronic  medical problems.

## 2018-01-30 ENCOUNTER — Encounter: Payer: Self-pay | Admitting: Internal Medicine

## 2018-03-25 DIAGNOSIS — J069 Acute upper respiratory infection, unspecified: Secondary | ICD-10-CM | POA: Diagnosis not present

## 2018-03-26 ENCOUNTER — Encounter: Payer: Self-pay | Admitting: Internal Medicine

## 2018-03-26 ENCOUNTER — Other Ambulatory Visit: Payer: Self-pay

## 2018-03-26 ENCOUNTER — Ambulatory Visit: Payer: BLUE CROSS/BLUE SHIELD | Admitting: Internal Medicine

## 2018-03-26 VITALS — BP 122/84 | HR 78 | Temp 98.8°F | Resp 16 | Ht 60.0 in | Wt 134.8 lb

## 2018-03-26 DIAGNOSIS — R509 Fever, unspecified: Secondary | ICD-10-CM | POA: Diagnosis not present

## 2018-03-26 DIAGNOSIS — R51 Headache: Secondary | ICD-10-CM

## 2018-03-26 DIAGNOSIS — J069 Acute upper respiratory infection, unspecified: Secondary | ICD-10-CM

## 2018-03-26 DIAGNOSIS — R519 Headache, unspecified: Secondary | ICD-10-CM | POA: Insufficient documentation

## 2018-03-26 LAB — POCT INFLUENZA A/B
INFLUENZA A, POC: NEGATIVE
Influenza B, POC: NEGATIVE

## 2018-03-26 MED ORDER — PROMETHAZINE-DM 6.25-15 MG/5ML PO SYRP: 5.0000 mL | ORAL_SOLUTION | Freq: Four times a day (QID) | ORAL | 0 refills | Status: DC | PRN

## 2018-03-26 MED ORDER — METHYLPREDNISOLONE 4 MG PO TBPK: ORAL_TABLET | ORAL | 0 refills | Status: DC

## 2018-03-26 NOTE — Assessment & Plan Note (Addendum)
Severe in nature Continue with tylenol, sudafed Medrol dose pak

## 2018-03-26 NOTE — Assessment & Plan Note (Addendum)
Symptoms consistent with viral infection Influenza neg No need for antibiotics Prescription cough syrup Cold medications, tylenol Rest, fluids Call if no improvement

## 2018-03-26 NOTE — Progress Notes (Signed)
Subjective:    Patient ID: Brenda Gomez, female    DOB: 29-May-1960, 58 y.o.   MRN: [id number]  HPI She is here for an acute visit for cold symptoms.  Her symptoms started three days ago  She is experiencing tickle cough that has gotten worse, chills, temp of 100.5, body aches, sore throat, decreased appetite, nausea, and headaches.  Her sinus headache is bad.  She denies nasal congestion, ear pain, sinus pain, SOB and wheeze.  She denies diarrhea.    She has taken mucinex, sudafed, tylenol.    Medications and allergies reviewed with patient and updated if appropriate.  Patient Active Problem List   Diagnosis Date Noted  . Genetic testing 05/30/2016  . Family history of Lynch syndrome 05/09/2016  . Family history of colon cancer   . Family history of kidney cancer   . Acute non-recurrent maxillary sinusitis 01/04/2013  . History of CVA (cerebrovascular accident) 12/02/2008  . PARESTHESIA 08/20/2008  . Prediabetes 08/20/2008  . Essential hypertension 08/16/2007  . NEPHROLITHIASIS, HX OF 08/16/2007  . HYPERLIPIDEMIA 04/06/2007    Current Outpatient Medications on File Prior to Visit  Medication Sig Dispense Refill  . alendronate (FOSAMAX) 70 MG tablet Take 70 mg by mouth once a week. Take with a full glass of water on an empty stomach.    Marland Kitchen aspirin 81 MG tablet Take 1 tablet (81 mg total) by mouth daily. 30 tablet   . Calcium Carbonate (CALTRATE 600 PO) Take by mouth. Reported on 06/04/2015    . cetirizine (ZYRTEC) 10 MG tablet Take 10 mg by mouth every evening.    . cholecalciferol (VITAMIN D) 1000 units tablet Take 1 tablet (1,000 Units total) by mouth daily.    Marland Kitchen diltiazem (CARDIZEM) 90 MG tablet Take 1 tablet (90 mg total) by mouth 2 (two) times daily. 180 tablet 3  . fish oil-omega-3 fatty acids 1000 MG capsule Take 1 g by mouth 2 (two) times daily.     Marland Kitchen gabapentin (NEURONTIN) 100 MG capsule Take 200 mg by mouth at bedtime.  5  . lisinopril (PRINIVIL,ZESTRIL) 40 MG  tablet Take 1/2 tablet twice daily 90 tablet 3  . methylPREDNISolone (MEDROL DOSEPAK) 4 MG TBPK tablet 24 mg PO on day 1, then decr. by 4 mg/day x5 days 21 tablet 0  . Multiple Vitamin (MULITIVITAMIN WITH MINERALS) TABS Take 1 tablet by mouth daily.    . Potassium Citrate 15 MEQ (1620 MG) TBCR Take 1 tablet by mouth 2 (two) times daily.  11  . pravastatin (PRAVACHOL) 20 MG tablet Take 1 tablet (20 mg total) by mouth at bedtime. 90 tablet 3  . promethazine-dextromethorphan (PROMETHAZINE-DM) 6.25-15 MG/5ML syrup Take 5 mLs by mouth 4 (four) times daily as needed. 180 mL 0   No current facility-administered medications on file prior to visit.     Past Medical History:  Diagnosis Date  . Allergic rhinitis   . Allergy   . Blood transfusion without reported diagnosis   . Dyslipidemia   . Family history of colon cancer   . Family history of kidney cancer   . Family history of Lynch syndrome   . Hand tingling left hand , occasional  . Headache(784.0)    Dr Jannifer Franklin Rxed Nortriptyline  . History of cerebral embolism with infarction-  3 yrs ago,  age 67   residual left hand occasional numb/ tingle-- NEUROLOGIST  DR WILLIS-- LAST NOTE 02-25-2009 W/ CHART  . History of cerebrovascular accident with residual effects   .  History of kidney stones    Dr Junious Silk  . Hypertension   . PFO (patent foramen ovale) small- per Dr  Jannifer Franklin, positive transcranial bubble study, and tee echo  . Renal cyst left  . Stroke [facility]) 2010    Past Surgical History:  Procedure Laterality Date  . APPENDECTOMY  2003  . CARDIOVASCULAR STRESS TEST  2004   NORMAL  . CYSTOSCOPY W/ RETROGRADES  03/24/2011   Procedure: CYSTOSCOPY WITH RETROGRADE PYELOGRAM;  Surgeon: Fredricka Bonine, MD;  Location: [facility];  Service: Urology;  Laterality: Right;  RT RPG, RT URETERAL STENT, BLADDER BIOPSY AND FULGERATION   . CYSTOSCOPY WITH RETROGRADE PYELOGRAM, URETEROSCOPY AND STENT PLACEMENT Right 07/11/2016    Procedure: CYSTOSCOPY, URETEROSCOPY AND RIGHT STENT PLACEMENT;  Surgeon: Irine Seal, MD;  Location: [facility];  Service: Urology;  Laterality: Right;  . [place] X2  , 2013   LEFT X 2; R X 1. Dr Junious Silk, [facility]  . NEPHROLITHOTOMY  1998   LEFT KIDNEY STONE  . TRANSCRANIAL DOPPLER  11-20-2008   POSITIVE BUBBLE STUDY - SMALL INTRACARDIAC RIGHT TO LEFT SHUNT  . TRANSESOPHAGEAL ECHOCARDIOGRAM  01-19-2009   SMALL PFO/ NO SIGNIFICANT VALVULAR DISEASE/ EF 60-65%  . WISDOM TOOTH EXTRACTION      Social History   Socioeconomic History  . Marital status: Married    Spouse name: Not on file  . Number of children: Not on file  . Years of education: Not on file  . Highest education level: Not on file  Occupational History  . Not on file  Social Needs  . Financial resource strain: Not on file  . Food insecurity:    Worry: Not on file    Inability: Not on file  . Transportation needs:    Medical: Not on file    Non-medical: Not on file  Tobacco Use  . Smoking status: Former Smoker    Packs/day: 0.25    Years: 20.00    Pack years: 5.00    Types: Cigarettes    Last attempt to quit: 03/22/2010    Years since quitting: 8.0  . Smokeless tobacco: Never Used  . Tobacco comment: smoked 1980- 2012, up to 1 pp WEEK  Substance and Sexual Activity  . Alcohol use: No    Alcohol/week: 0.0 standard drinks  . Drug use: No  . Sexual activity: Not on file  Lifestyle  . Physical activity:    Days per week: Not on file    Minutes per session: Not on file  . Stress: Not on file  Relationships  . Social connections:    Talks on phone: Not on file    Gets together: Not on file    Attends religious service: Not on file    Active member of club or organization: Not on file    Attends meetings of clubs or organizations: Not on file    Relationship status: Not on file  Other Topics Concern  . Not on file  Social History Narrative   Exercise: doing Pur bar      Family History  Problem Relation Age of Onset  . Diabetes Mother   . Stroke Mother 79  . Nephrolithiasis Mother   . Cancer Sister 8       sarcoma; maternal half sister  . Diabetes Maternal Aunt   . Kidney cancer Maternal Aunt        dx in her 68s  . Diabetes Maternal Grandmother   .  Heart attack Maternal Grandmother        > 65  . Cancer Maternal Grandfather        melanoma with pulmonary mets  . Brain cancer Other        MGF's sister  . Colon cancer Brother 52       maternal half brother  . Lung cancer Paternal Uncle        smoker  . Lymphoma Paternal Grandmother        hodgkins  . Heart attack Paternal Grandfather   . Colon cancer Cousin        maternal first cousin dx in her 56s  . Cancer Other        NOS; MGFs brother  . Colon polyps Neg Hx   . Esophageal cancer Neg Hx   . Rectal cancer Neg Hx   . Stomach cancer Neg Hx     Review of Systems  Constitutional: Positive for appetite change (decreased), chills and fever.  HENT: Positive for sore throat. Negative for congestion, ear pain and sinus pain.   Eyes: Positive for discharge (eyes watering).  Respiratory: Positive for cough. Negative for shortness of breath and wheezing.   Gastrointestinal: Positive for nausea. Negative for abdominal pain and diarrhea.  Musculoskeletal: Positive for myalgias.  Neurological: Positive for headaches. Negative for dizziness and light-headedness.       Objective:   Vitals:   03/26/18 1424  BP: 122/84  Pulse: 78  Resp: 16  Temp: 98.8 F (37.1 C)  SpO2: 97%   Filed Weights   03/26/18 1424  Weight: 134 lb 12.8 oz (61.1 kg)   Body mass index is 26.33 kg/m.  Wt Readings from Last 3 Encounters:  03/26/18 134 lb 12.8 oz (61.1 kg)  01/23/18 134 lb 12.8 oz (61.1 kg)  08/09/17 136 lb (61.7 kg)     Physical Exam GENERAL APPEARANCE: Appears stated age, well appearing, NAD EYES: conjunctiva clear, no icterus HEENT: bilateral tympanic membranes and ear canals normal,  oropharynx with no erythema, no thyromegaly, trachea midline, no cervical or supraclavicular lymphadenopathy LUNGS: Clear to auscultation without wheeze or crackles, unlabored breathing, good air entry bilaterally CARDIOVASCULAR: Normal S1,S2 without murmurs, no edema SKIN: warm, dry        Assessment & Plan:   See Problem List for Assessment and Plan of chronic medical problems.

## 2018-03-26 NOTE — Patient Instructions (Addendum)
Your symptoms are consistent with a viral illness.  An antibiotic will not help.  Continue tylenol, mucinex and sudafed if needed.    Use the prescription cough syrup as needed.    Take the medrol dose pak    Call if no improvement

## 2018-03-28 ENCOUNTER — Telehealth: Payer: Self-pay | Admitting: Internal Medicine

## 2018-03-28 ENCOUNTER — Other Ambulatory Visit: Payer: Self-pay | Admitting: Internal Medicine

## 2018-03-28 NOTE — Telephone Encounter (Signed)
Copied from [place] [id number]. Topic: Quick Communication - Rx Refill/Question >> Mar 28, 2018  8:23 AM Alanda Slim E wrote: Medication: promethazine-dextromethorphan (PROMETHAZINE-DM) 6.25-15 MG/5ML syrup  Has the patient contacted their pharmacy? Yes (medication on world wide back order) something else needs to be called in for Pt to relieve cough    Preferred Pharmacy (with phone number or street name): [id number] - [place], [place] [phone] (Phone) [phone] (Fax)    Agent: Please be advised that RX refills may take up to 3 business days. We ask that you follow-up with your pharmacy.

## 2018-03-28 NOTE — Telephone Encounter (Signed)
Medication: promethazine-dextromethorphan (PROMETHAZINE-DM) 6.25-15 MG/5ML syrup  Medication on world wide back order. Requesting alternative   Preferred Pharmacy (with phone number or street name): [id number] - [place], [place]        [phone] (Phone) [phone] (Fax)

## 2018-03-28 NOTE — Telephone Encounter (Signed)
Alternative sent

## 2018-08-07 ENCOUNTER — Other Ambulatory Visit: Payer: Self-pay | Admitting: Internal Medicine

## 2018-08-13 NOTE — Patient Instructions (Addendum)
Tests ordered today. Your results will be released to [place] (or called to you) after review, usually within 72hours after test completion. If any changes need to be made, you will be notified at that same time.  All other Health Maintenance issues reviewed.   All recommended immunizations and age-appropriate screenings are up-to-date or discussed.  No immunizations administered today.   Medications reviewed and updated.  Changes include :   none      Your prescription(s) have been submitted to your pharmacy. Please take as directed and contact our office if you believe you are having problem(s) with the medication(s).   Please followup in 1 year   Health Maintenance, Female Adopting a healthy lifestyle and getting preventive care can go a long way to promote health and wellness. Talk with your health care provider about what schedule of regular examinations is right for you. This is a good chance for you to check in with your provider about disease prevention and staying healthy. In between checkups, there are plenty of things you can do on your own. Experts have done a lot of research about which lifestyle changes and preventive measures are most likely to keep you healthy. Ask your health care provider for more information. Weight and diet Eat a healthy diet  Be sure to include plenty of vegetables, fruits, low-fat dairy products, and lean protein.  Do not eat a lot of foods high in solid fats, added sugars, or salt.  Get regular exercise. This is one of the most important things you can do for your health. ? Most adults should exercise for at least 150 minutes each week. The exercise should increase your heart rate and make you sweat (moderate-intensity exercise). ? Most adults should also do strengthening exercises at least twice a week. This is in addition to the moderate-intensity exercise. Maintain a healthy weight  Body mass index (BMI) is a measurement that can be used to  identify possible weight problems. It estimates body fat based on height and weight. Your health care provider can help determine your BMI and help you achieve or maintain a healthy weight.  For females 69 years of age and older: ? A BMI below 18.5 is considered underweight. ? A BMI of 18.5 to 24.9 is normal. ? A BMI of 25 to 29.9 is considered overweight. ? A BMI of 30 and above is considered obese. Watch levels of cholesterol and blood lipids  You should start having your blood tested for lipids and cholesterol at 56 years of age, then have this test every 5 years.  You may need to have your cholesterol levels checked more often if: ? Your lipid or cholesterol levels are high. ? You are older than 56 years of age. ? You are at high risk for heart disease. Cancer screening Lung Cancer  Lung cancer screening is recommended for adults 25-47 years old who are at high risk for lung cancer because of a history of smoking.  A yearly low-dose CT scan of the lungs is recommended for people who: ? Currently smoke. ? Have quit within the past 15 years. ? Have at least a 30-pack-year history of smoking. A pack year is smoking an average of one pack of cigarettes a day for 1 year.  Yearly screening should continue until it has been 15 years since you quit.  Yearly screening should stop if you develop a health problem that would prevent you from having lung cancer treatment. Breast Cancer  Practice breast self-awareness.  This means understanding how your breasts normally appear and feel.  It also means doing regular breast self-exams. Let your health care provider know about any changes, no matter how small.  If you are in your 20s or 30s, you should have a clinical breast exam (CBE) by a health care provider every 1-3 years as part of a regular health exam.  If you are 6 or older, have a CBE every year. Also consider having a breast X-ray (mammogram) every year.  If you have a family  history of breast cancer, talk to your health care provider about genetic screening.  If you are at high risk for breast cancer, talk to your health care provider about having an MRI and a mammogram every year.  Breast cancer gene (BRCA) assessment is recommended for women who have family members with BRCA-related cancers. BRCA-related cancers include: ? Breast. ? Ovarian. ? Tubal. ? Peritoneal cancers.  Results of the assessment will determine the need for genetic counseling and BRCA1 and BRCA2 testing. Cervical Cancer Your health care provider may recommend that you be screened regularly for cancer of the pelvic organs (ovaries, uterus, and vagina). This screening involves a pelvic examination, including checking for microscopic changes to the surface of your cervix (Pap test). You may be encouraged to have this screening done every 3 years, beginning at age 12.  For women ages 30-65, health care providers may recommend pelvic exams and Pap testing every 3 years, or they may recommend the Pap and pelvic exam, combined with testing for human papilloma virus (HPV), every 5 years. Some types of HPV increase your risk of cervical cancer. Testing for HPV may also be done on women of any age with unclear Pap test results.  Other health care providers may not recommend any screening for nonpregnant women who are considered low risk for pelvic cancer and who do not have symptoms. Ask your health care provider if a screening pelvic exam is right for you.  If you have had past treatment for cervical cancer or a condition that could lead to cancer, you need Pap tests and screening for cancer for at least 20 years after your treatment. If Pap tests have been discontinued, your risk factors (such as having a new sexual partner) need to be reassessed to determine if screening should resume. Some women have medical problems that increase the chance of getting cervical cancer. In these cases, your health care  provider may recommend more frequent screening and Pap tests. Colorectal Cancer  This type of cancer can be detected and often prevented.  Routine colorectal cancer screening usually begins at 56 years of age and continues through 56 years of age.  Your health care provider may recommend screening at an earlier age if you have risk factors for colon cancer.  Your health care provider may also recommend using home test kits to check for hidden blood in the stool.  A small camera at the end of a tube can be used to examine your colon directly (sigmoidoscopy or colonoscopy). This is done to check for the earliest forms of colorectal cancer.  Routine screening usually begins at age 69.  Direct examination of the colon should be repeated every 5-10 years through 56 years of age. However, you may need to be screened more often if early forms of precancerous polyps or small growths are found. Skin Cancer  Check your skin from head to toe regularly.  Tell your health care provider about any new moles or  changes in moles, especially if there is a change in a mole's shape or color.  Also tell your health care provider if you have a mole that is larger than the size of a pencil eraser.  Always use sunscreen. Apply sunscreen liberally and repeatedly throughout the day.  Protect yourself by wearing long sleeves, pants, a wide-brimmed hat, and sunglasses whenever you are outside. Heart disease, diabetes, and high blood pressure  High blood pressure causes heart disease and increases the risk of stroke. High blood pressure is more likely to develop in: ? People who have blood pressure in the high end of the normal range (130-139/85-89 mm Hg). ? People who are overweight or obese. ? People who are African American.  If you are 23-41 years of age, have your blood pressure checked every 3-5 years. If you are 35 years of age or older, have your blood pressure checked every year. You should have your  blood pressure measured twice-once when you are at a hospital or clinic, and once when you are not at a hospital or clinic. Record the average of the two measurements. To check your blood pressure when you are not at a hospital or clinic, you can use: ? An automated blood pressure machine at a pharmacy. ? A home blood pressure monitor.  If you are between 4 years and 16 years old, ask your health care provider if you should take aspirin to prevent strokes.  Have regular diabetes screenings. This involves taking a blood sample to check your fasting blood sugar level. ? If you are at a normal weight and have a low risk for diabetes, have this test once every three years after 56 years of age. ? If you are overweight and have a high risk for diabetes, consider being tested at a younger age or more often. Preventing infection Hepatitis B  If you have a higher risk for hepatitis B, you should be screened for this virus. You are considered at high risk for hepatitis B if: ? You were born in a country where hepatitis B is common. Ask your health care provider which countries are considered high risk. ? Your parents were born in a high-risk country, and you have not been immunized against hepatitis B (hepatitis B vaccine). ? You have HIV or AIDS. ? You use needles to inject street drugs. ? You live with someone who has hepatitis B. ? You have had sex with someone who has hepatitis B. ? You get hemodialysis treatment. ? You take certain medicines for conditions, including cancer, organ transplantation, and autoimmune conditions. Hepatitis C  Blood testing is recommended for: ? Everyone born from [birth year] through 1963. ? Anyone with known risk factors for hepatitis C. Sexually transmitted infections (STIs)  You should be screened for sexually transmitted infections (STIs) including gonorrhea and chlamydia if: ? You are sexually active and are younger than 56 years of age. ? You are older than 56  years of age and your health care provider tells you that you are at risk for this type of infection. ? Your sexual activity has changed since you were last screened and you are at an increased risk for chlamydia or gonorrhea. Ask your health care provider if you are at risk.  If you do not have HIV, but are at risk, it may be recommended that you take a prescription medicine daily to prevent HIV infection. This is called pre-exposure prophylaxis (PrEP). You are considered at risk if: ? You are sexually  active and do not regularly use condoms or know the HIV status of your partner(s). ? You take drugs by injection. ? You are sexually active with a partner who has HIV. Talk with your health care provider about whether you are at high risk of being infected with HIV. If you choose to begin PrEP, you should first be tested for HIV. You should then be tested every 3 months for as long as you are taking PrEP. Pregnancy  If you are premenopausal and you may become pregnant, ask your health care provider about preconception counseling.  If you may become pregnant, take 400 to 800 micrograms (mcg) of folic acid every day.  If you want to prevent pregnancy, talk to your health care provider about birth control (contraception). Osteoporosis and menopause  Osteoporosis is a disease in which the bones lose minerals and strength with aging. This can result in serious bone fractures. Your risk for osteoporosis can be identified using a bone density scan.  If you are 52 years of age or older, or if you are at risk for osteoporosis and fractures, ask your health care provider if you should be screened.  Ask your health care provider whether you should take a calcium or vitamin D supplement to lower your risk for osteoporosis.  Menopause may have certain physical symptoms and risks.  Hormone replacement therapy may reduce some of these symptoms and risks. Talk to your health care provider about whether  hormone replacement therapy is right for you. Follow these instructions at home:  Schedule regular health, dental, and eye exams.  Stay current with your immunizations.  Do not use any tobacco products including cigarettes, chewing tobacco, or electronic cigarettes.  If you are pregnant, do not drink alcohol.  If you are breastfeeding, limit how much and how often you drink alcohol.  Limit alcohol intake to no more than 1 drink per day for nonpregnant women. One drink equals 12 ounces of beer, 5 ounces of wine, or 1 ounces of hard liquor.  Do not use street drugs.  Do not share needles.  Ask your health care provider for help if you need support or information about quitting drugs.  Tell your health care provider if you often feel depressed.  Tell your health care provider if you have ever been abused or do not feel safe at home. This information is not intended to replace advice given to you by your health care provider. Make sure you discuss any questions you have with your health care provider. Document Released: 08/23/2010 Document Revised: 07/16/2015 Document Reviewed: 11/11/2014 Elsevier Interactive Patient Education  2019 [employer].

## 2018-08-13 NOTE — Progress Notes (Signed)
Subjective:    Patient ID: Brenda Gomez, female    DOB: [birth year], 58 y.o.   MRN: [id number]  HPI She is here for a physical exam.   She denies changes in her health since she was here last and has no concerns.  She is working from home since the pandemic started.  She is active, but not exercising as much.   Medications and allergies reviewed with patient and updated if appropriate.  Patient Active Problem List   Diagnosis Date Noted  . Osteoporosis 08/15/2018  . Genetic testing 05/30/2016  . Family history of Lynch syndrome 05/09/2016  . Family history of colon cancer   . Family history of kidney cancer   . History of CVA (cerebrovascular accident) 12/02/2008  . PARESTHESIA 08/20/2008  . Prediabetes 08/20/2008  . Essential hypertension 08/16/2007  . NEPHROLITHIASIS, HX OF 08/16/2007  . HYPERLIPIDEMIA 04/06/2007    Current Outpatient Medications on File Prior to Visit  Medication Sig Dispense Refill  . alendronate (FOSAMAX) 70 MG tablet Take 70 mg by mouth once a week. Take with a full glass of water on an empty stomach.    Marland Kitchen aspirin 81 MG tablet Take 1 tablet (81 mg total) by mouth daily. 30 tablet   . Calcium Carbonate (CALTRATE 600 PO) Take by mouth. Reported on 06/04/2015    . cetirizine (ZYRTEC) 10 MG tablet Take 10 mg by mouth every evening.    . cholecalciferol (VITAMIN D) 1000 units tablet Take 1 tablet (1,000 Units total) by mouth daily.    Marland Kitchen diltiazem (CARDIZEM) 90 MG tablet Take 1 tablet (90 mg total) by mouth 2 (two) times daily. Annual appt is due must see provider for future refills 60 tablet 0  . fish oil-omega-3 fatty acids 1000 MG capsule Take 1 g by mouth 2 (two) times daily.     Marland Kitchen gabapentin (NEURONTIN) 100 MG capsule Take 200 mg by mouth at bedtime.  5  . lisinopril (PRINIVIL,ZESTRIL) 40 MG tablet Take 1/2 tablet twice daily 90 tablet 3  . Multiple Vitamin (MULITIVITAMIN WITH MINERALS) TABS Take 1 tablet by mouth daily.    . Potassium Citrate 15 MEQ  (1620 MG) TBCR Take 1 tablet by mouth 2 (two) times daily.  11  . pravastatin (PRAVACHOL) 20 MG tablet Take 1 tablet (20 mg total) by mouth daily. Annual appt is due must see provider for future refills 30 tablet 0   No current facility-administered medications on file prior to visit.     Past Medical History:  Diagnosis Date  . Allergic rhinitis   . Allergy   . Blood transfusion without reported diagnosis   . Dyslipidemia   . Family history of colon cancer   . Family history of kidney cancer   . Family history of Lynch syndrome   . Hand tingling left hand , occasional  . Headache(784.0)    Dr Jannifer Franklin Rxed Nortriptyline  . History of cerebral embolism with infarction-  3 yrs ago,  age 48   residual left hand occasional numb/ tingle-- NEUROLOGIST  DR WILLIS-- LAST NOTE 02-25-2009 W/ CHART  . History of cerebrovascular accident with residual effects   . History of kidney stones    Dr Junious Silk  . Hypertension   . PFO (patent foramen ovale) small- per Dr  Jannifer Franklin, positive transcranial bubble study, and tee echo  . Renal cyst left  . Stroke [facility]) 2010    Past Surgical History:  Procedure Laterality Date  . APPENDECTOMY  2003  .  CARDIOVASCULAR STRESS TEST  2004   NORMAL  . CYSTOSCOPY W/ RETROGRADES  03/24/2011   Procedure: CYSTOSCOPY WITH RETROGRADE PYELOGRAM;  Surgeon: Fredricka Bonine, MD;  Location[facility];  Service: Urology;  Laterality: Right;  RT RPG, RT URETERAL STENT, BLADDER BIOPSY AND FULGERATION   . CYSTOSCOPY WITH RETROGRADE PYELOGRAM, URETEROSCOPY AND STENT PLACEMENT Right 07/11/2016   Procedure: CYSTOSCOPY, URETEROSCOPY AND RIGHT STENT PLACEMENT;  Surgeon: Irine Seal, MD;  Location: [facility];  Service: Urology;  Laterality: Right;  . [place] X2  , 2013   LEFT X 2; R X 1. Dr Junious Silk, [facility]  . NEPHROLITHOTOMY  1998   LEFT KIDNEY STONE  . TRANSCRANIAL DOPPLER  11-20-2008   POSITIVE BUBBLE STUDY -  SMALL INTRACARDIAC RIGHT TO LEFT SHUNT  . TRANSESOPHAGEAL ECHOCARDIOGRAM  01-19-2009   SMALL PFO/ NO SIGNIFICANT VALVULAR DISEASE/ EF 60-65%  . WISDOM TOOTH EXTRACTION      Social History   Socioeconomic History  . Marital status: Married    Spouse name: Not on file  . Number of children: Not on file  . Years of education: Not on file  . Highest education level: Not on file  Occupational History  . Not on file  Social Needs  . Financial resource strain: Not on file  . Food insecurity    Worry: Not on file    Inability: Not on file  . Transportation needs    Medical: Not on file    Non-medical: Not on file  Tobacco Use  . Smoking status: Former Smoker    Packs/day: 0.25    Years: 20.00    Pack years: 5.00    Types: Cigarettes    Quit date: 03/22/2010    Years since quitting: 8.4  . Smokeless tobacco: Never Used  . Tobacco comment: smoked 1980- 2012, up to 1 pp WEEK  Substance and Sexual Activity  . Alcohol use: No    Alcohol/week: 0.0 standard drinks  . Drug use: No  . Sexual activity: Not on file  Lifestyle  . Physical activity    Days per week: Not on file    Minutes per session: Not on file  . Stress: Not on file  Relationships  . Social Herbalist on phone: Not on file    Gets together: Not on file    Attends religious service: Not on file    Active member of club or organization: Not on file    Attends meetings of clubs or organizations: Not on file    Relationship status: Not on file  Other Topics Concern  . Not on file  Social History Narrative   Exercise: doing Pur bar    Family History  Problem Relation Age of Onset  . Diabetes Mother   . Stroke Mother 6  . Nephrolithiasis Mother   . Cancer Sister 8       sarcoma; maternal half sister  . Diabetes Maternal Aunt   . Kidney cancer Maternal Aunt        dx in her 38s  . Diabetes Maternal Grandmother   . Heart attack Maternal Grandmother        > 65  . Cancer Maternal Grandfather         melanoma with pulmonary mets  . Brain cancer Other        MGF's sister  . Colon cancer Brother 67       maternal half brother  .  Lung cancer Paternal Uncle        smoker  . Lymphoma Paternal Grandmother        hodgkins  . Heart attack Paternal Grandfather   . Colon cancer Cousin        maternal first cousin dx in her 74s  . Cancer Other        NOS; MGFs brother  . Colon polyps Neg Hx   . Esophageal cancer Neg Hx   . Rectal cancer Neg Hx   . Stomach cancer Neg Hx     Review of Systems  Constitutional: Negative for chills and fever.  Eyes: Negative for visual disturbance.  Respiratory: Negative for cough, shortness of breath and wheezing.   Cardiovascular: Negative for chest pain, palpitations and leg swelling.  Gastrointestinal: Negative for abdominal pain, blood in stool, constipation, diarrhea and nausea.       No gerd  Genitourinary: Negative for dysuria and hematuria.  Musculoskeletal: Negative for arthralgias and back pain.  Skin: Negative for color change and rash.  Neurological: Negative for dizziness, light-headedness, numbness and headaches.  Psychiatric/Behavioral: Negative for dysphoric mood. The patient is not nervous/anxious.        Objective:   Vitals:   08/15/18 0744  BP: 108/60  Pulse: (!) 54  Resp: 16  Temp: 98.7 F (37.1 C)  SpO2: 96%   Filed Weights   08/15/18 0744  Weight: 133 lb (60.3 kg)   Body mass index is 25.97 kg/m.  BP Readings from Last 3 Encounters:  08/15/18 108/60  03/26/18 122/84  01/23/18 120/72    Wt Readings from Last 3 Encounters:  08/15/18 133 lb (60.3 kg)  03/26/18 134 lb 12.8 oz (61.1 kg)  01/23/18 134 lb 12.8 oz (61.1 kg)     Physical Exam Constitutional: She appears well-developed and well-nourished. No distress.  HENT:  Head: Normocephalic and atraumatic.  Right Ear: External ear normal. Normal ear canal and TM Left Ear: External ear normal.  Normal ear canal and TM Mouth/Throat: Oropharynx is clear and  moist.  Eyes: Conjunctivae and EOM are normal.  Neck: Neck supple. No tracheal deviation present. No thyromegaly present. No carotid bruit  Cardiovascular: Normal rate, regular rhythm and normal heart sounds.   No murmur heard.  No edema. Pulmonary/Chest: Effort normal and breath sounds normal. No respiratory distress. She has no wheezes. She has no rales.  Breast: deferred   Abdominal: Soft. She exhibits no distension. There is no tenderness.  Lymphadenopathy: She has no cervical adenopathy.  Skin: Skin is warm and dry. She is not diaphoretic.  Psychiatric: She has a normal mood and affect. Her behavior is normal.        Assessment & Plan:   Physical exam: Screening blood work ordered Immunizations  Discussed shingrix, others up to date Colonoscopy   Up to date  Mammogram    Up to date - due next month Gyn   Up to date   - Dr Radene Knee Dexa - done by [place] exams  Up to date  Exercise  Not regulalry Weight    BMI normal Skin  No concerns Substance abuse   none  See Problem List for Assessment and Plan of chronic medical problems.

## 2018-08-15 ENCOUNTER — Encounter: Payer: Self-pay | Admitting: Internal Medicine

## 2018-08-15 ENCOUNTER — Ambulatory Visit (INDEPENDENT_AMBULATORY_CARE_PROVIDER_SITE_OTHER): Payer: BC Managed Care – PPO | Admitting: Internal Medicine

## 2018-08-15 ENCOUNTER — Other Ambulatory Visit: Payer: Self-pay

## 2018-08-15 ENCOUNTER — Other Ambulatory Visit (INDEPENDENT_AMBULATORY_CARE_PROVIDER_SITE_OTHER): Payer: BC Managed Care – PPO

## 2018-08-15 VITALS — BP 108/60 | HR 54 | Temp 98.7°F | Resp 16 | Ht 60.0 in | Wt 133.0 lb

## 2018-08-15 DIAGNOSIS — I10 Essential (primary) hypertension: Secondary | ICD-10-CM | POA: Diagnosis not present

## 2018-08-15 DIAGNOSIS — E782 Mixed hyperlipidemia: Secondary | ICD-10-CM | POA: Diagnosis not present

## 2018-08-15 DIAGNOSIS — R7303 Prediabetes: Secondary | ICD-10-CM

## 2018-08-15 DIAGNOSIS — M810 Age-related osteoporosis without current pathological fracture: Secondary | ICD-10-CM | POA: Insufficient documentation

## 2018-08-15 DIAGNOSIS — Z0000 Encounter for general adult medical examination without abnormal findings: Secondary | ICD-10-CM

## 2018-08-15 LAB — COMPREHENSIVE METABOLIC PANEL
ALT: 31 U/L (ref 0–35)
AST: 21 U/L (ref 0–37)
Albumin: 4.7 g/dL (ref 3.5–5.2)
Alkaline Phosphatase: 42 U/L (ref 39–117)
BUN: 27 mg/dL — ABNORMAL HIGH (ref 6–23)
CO2: 28 mEq/L (ref 19–32)
Calcium: 9.5 mg/dL (ref 8.4–10.5)
Chloride: 102 mEq/L (ref 96–112)
Creatinine, Ser: 0.87 mg/dL (ref 0.40–1.20)
GFR: 67.41 mL/min (ref >60.00)
Glucose, Bld: 102 mg/dL — ABNORMAL HIGH (ref 70–99)
Potassium: 4.6 mEq/L (ref 3.5–5.1)
Sodium: 139 mEq/L (ref 135–145)
Total Bilirubin: 0.5 mg/dL (ref 0.2–1.2)
Total Protein: 7.2 g/dL (ref 6.0–8.3)

## 2018-08-15 LAB — CBC WITH DIFFERENTIAL/PLATELET
Basophils Absolute: 0 10*3/uL (ref 0.0–0.1)
Basophils Relative: 0.7 % (ref 0.0–3.0)
Eosinophils Absolute: 0.1 10*3/uL (ref 0.0–0.7)
Eosinophils Relative: 2.2 % (ref 0.0–5.0)
HCT: 42.4 % (ref 36.0–46.0)
Hemoglobin: 14.4 g/dL (ref 12.0–15.0)
Lymphocytes Relative: 46.4 % — ABNORMAL HIGH (ref 12.0–46.0)
Lymphs Abs: 2.4 10*3/uL (ref 0.7–4.0)
MCHC: 33.9 g/dL (ref 30.0–36.0)
MCV: 87.9 fl (ref 78.0–100.0)
Monocytes Absolute: 0.5 10*3/uL (ref 0.1–1.0)
Monocytes Relative: 9.2 % (ref 3.0–12.0)
Neutro Abs: 2.2 10*3/uL (ref 1.4–7.7)
Neutrophils Relative %: 41.5 % — ABNORMAL LOW (ref 43.0–77.0)
Platelets: 261 10*3/uL (ref 150.0–400.0)
RBC: 4.82 Mil/uL (ref 3.87–5.11)
RDW: 12.7 % (ref 11.5–15.5)
WBC: 5.3 10*3/uL (ref 4.0–10.5)

## 2018-08-15 LAB — LIPID PANEL
Cholesterol: 226 mg/dL — ABNORMAL HIGH (ref 0–200)
HDL: 85.7 mg/dL
LDL Cholesterol: 127 mg/dL — ABNORMAL HIGH (ref 0–99)
NonHDL: 140.67
Total CHOL/HDL Ratio: 3
Triglycerides: 70 mg/dL (ref 0.0–149.0)
VLDL: 14 mg/dL (ref 0.0–40.0)

## 2018-08-15 LAB — HEMOGLOBIN A1C: Hgb A1c MFr Bld: 5.9 % (ref 4.6–6.5)

## 2018-08-15 LAB — TSH: TSH: 1.47 u[IU]/mL (ref 0.35–4.50)

## 2018-08-15 MED ORDER — PRAVASTATIN SODIUM 20 MG PO TABS: 20.0000 mg | ORAL_TABLET | Freq: Every day | ORAL | 3 refills | Status: DC

## 2018-08-15 MED ORDER — DILTIAZEM HCL 90 MG PO TABS: 90.0000 mg | ORAL_TABLET | Freq: Two times a day (BID) | ORAL | 3 refills | Status: DC

## 2018-08-15 MED ORDER — LISINOPRIL 40 MG PO TABS: ORAL_TABLET | ORAL | 3 refills | Status: DC

## 2018-08-15 NOTE — Assessment & Plan Note (Signed)
BP well controlled Current regimen effective and well tolerated Continue current medications at current doses cmp  

## 2018-08-15 NOTE — Assessment & Plan Note (Signed)
Check a1c Low sugar / carb diet Stressed regular exercise   

## 2018-08-15 NOTE — Assessment & Plan Note (Signed)
Managed by GYN Taking alendronate

## 2018-08-15 NOTE — Assessment & Plan Note (Signed)
Check lipid panel,cmp ,tsh Continue daily statin Regular exercise and healthy diet encouraged  

## 2018-08-24 ENCOUNTER — Ambulatory Visit: Payer: Self-pay | Admitting: *Deleted

## 2018-08-24 ENCOUNTER — Telehealth: Payer: Self-pay | Admitting: Internal Medicine

## 2018-08-24 NOTE — Telephone Encounter (Signed)
Summary: ?pink eye   Patient calling and states that last night she notice her eye was starting to water and as the evening went on it became painful. States that she believes it is pink eye. States that through out the night last night when she would wake up, her eye would be matted closed. States that it is very painful and watery. Would like to know if there is anything OTC that she could get or home remedies that could help with this until office opens up? Please advise.      Returned call to patient regarding eye irritation and drainage from her right eye. She woke up with her eye matted shut. She stated that it hurt last night but not as bad today. Her sclera is blood shot and she has puffiness under that eye. Denies wearing contacts or blurred vision. Advised going to an urgent care. She had gotten eye drops (Clear Eyes) to try. She will try a warm compress to her eye and eye drops and call back if she needs to schedule an appointment with her provider. Routing encounter  to [facility] at [facility]. For review.  Reason for Disposition . [1] Eye with yellow/green discharge or eyelashes stick together AND [2] NO PCP standing order to call in antibiotic eye drops    Eye matted over night  Answer Assessment - Initial Assessment Questions 1. EYE DISCHARGE: "Is the discharge in one or both eyes?" "What color is it?" "How much is there?" "When did the discharge start?"      Right eye 2. REDNESS OF SCLERA: "Is the redness in one or both eyes?" "When did the redness start?"      bloodshot 3. EYELIDS: "Are the eyelids red or swollen?" If so, ask: "How much?"      Eyelids swollen 4. VISION: "Is there any difficulty seeing clearly?"      no 5. PAIN: "Is there any pain? If so, ask: "How bad is it?" (Scale 1-10; or mild, moderate, severe)    - MILD (1-3): doesn't interfere with normal activities     - MODERATE (4-7): interferes with normal activities or awakens from sleep    - SEVERE (8-10):  excruciating pain, unable to do any normal activities       mild 6. CONTACT LENS: "Do you wear contacts?"     no 7. OTHER SYMPTOMS: "Do you have any other symptoms?" (e.g., fever, runny nose, cough)     no 8. PREGNANCY: "Is there any chance you are pregnant?" "When was your last menstrual period?"     no  Protocols used: EYE - PUS OR DISCHARGE-A-AH

## 2018-08-24 NOTE — Telephone Encounter (Signed)
Patient calling and states that last night she notice her eye was starting to water and as the evening went on it became painful. States that she believes it is pink eye. States that through out the night last night when she would wake up, her eye would be matted closed. States that it is very painful and watery. Would like to know if Dr Quay Burow could prescribe an antibiotic for this issue since she just had an appointment with her on 08/15/2018? Please advise.  Sent a message to nurse triage while office closed for holiday weekend to give advice for OTC or home remedies to help until office opens.

## 2018-08-25 DIAGNOSIS — H10021 Other mucopurulent conjunctivitis, right eye: Secondary | ICD-10-CM | POA: Diagnosis not present

## 2018-08-27 DIAGNOSIS — H16201 Unspecified keratoconjunctivitis, right eye: Secondary | ICD-10-CM | POA: Diagnosis not present

## 2018-08-27 NOTE — Telephone Encounter (Signed)
LVM for pt to call and schedule an appointment

## 2018-08-27 NOTE — Telephone Encounter (Signed)
See triage note.

## 2018-09-03 DIAGNOSIS — H16201 Unspecified keratoconjunctivitis, right eye: Secondary | ICD-10-CM | POA: Diagnosis not present

## 2018-09-20 DIAGNOSIS — M810 Age-related osteoporosis without current pathological fracture: Secondary | ICD-10-CM | POA: Diagnosis not present

## 2018-09-20 DIAGNOSIS — N951 Menopausal and female climacteric states: Secondary | ICD-10-CM | POA: Diagnosis not present

## 2018-09-20 DIAGNOSIS — Z01419 Encounter for gynecological examination (general) (routine) without abnormal findings: Secondary | ICD-10-CM | POA: Diagnosis not present

## 2018-09-20 DIAGNOSIS — Z6827 Body mass index (BMI) 27.0-27.9, adult: Secondary | ICD-10-CM | POA: Diagnosis not present

## 2018-09-28 DIAGNOSIS — Z1231 Encounter for screening mammogram for malignant neoplasm of breast: Secondary | ICD-10-CM | POA: Diagnosis not present

## 2019-03-27 ENCOUNTER — Telehealth: Payer: Self-pay | Admitting: Internal Medicine

## 2019-03-27 MED ORDER — PRAVASTATIN SODIUM 20 MG PO TABS: 20.0000 mg | ORAL_TABLET | Freq: Every day | ORAL | 1 refills | Status: DC

## 2019-03-27 MED ORDER — DILTIAZEM HCL 90 MG PO TABS: 90.0000 mg | ORAL_TABLET | Freq: Two times a day (BID) | ORAL | 1 refills | Status: DC

## 2019-03-27 NOTE — Telephone Encounter (Signed)
Patient has scheduled CPE for June. States pharmacy informed her she needed appt before she could get refills but last AVS states patient to come back in a year.  Please advise pt.  She is requesting a script for pravastatin, and diltiazem to be be sent in.

## 2019-03-27 NOTE — Telephone Encounter (Signed)
Medications sent to pharmacy

## 2019-06-12 DIAGNOSIS — R351 Nocturia: Secondary | ICD-10-CM | POA: Diagnosis not present

## 2019-06-12 DIAGNOSIS — N200 Calculus of kidney: Secondary | ICD-10-CM | POA: Diagnosis not present

## 2019-06-22 DIAGNOSIS — Z8616 Personal history of COVID-19: Secondary | ICD-10-CM

## 2019-06-22 HISTORY — DX: Personal history of COVID-19: Z86.16

## 2019-07-10 DIAGNOSIS — Z20828 Contact with and (suspected) exposure to other viral communicable diseases: Secondary | ICD-10-CM | POA: Diagnosis not present

## 2019-07-10 DIAGNOSIS — Z03818 Encounter for observation for suspected exposure to other biological agents ruled out: Secondary | ICD-10-CM | POA: Diagnosis not present

## 2019-07-19 DIAGNOSIS — Z20822 Contact with and (suspected) exposure to covid-19: Secondary | ICD-10-CM | POA: Diagnosis not present

## 2019-07-19 DIAGNOSIS — Z20828 Contact with and (suspected) exposure to other viral communicable diseases: Secondary | ICD-10-CM | POA: Diagnosis not present

## 2019-08-01 ENCOUNTER — Other Ambulatory Visit: Payer: Self-pay | Admitting: Internal Medicine

## 2019-08-15 NOTE — Patient Instructions (Addendum)
Blood work was ordered.    All other Health Maintenance issues reviewed.   All recommended immunizations and age-appropriate screenings are up-to-date or discussed.  shingrix immunization administered today.   Medications reviewed and updated.  Changes include :  Metformin 500 mg twice daily.    Your prescription(s) have been submitted to your pharmacy. Please take as directed and contact our office if you believe you are having problem(s) with the medication(s).     Please followup in 1 year    Health Maintenance, Female Adopting a healthy lifestyle and getting preventive care are important in promoting health and wellness. Ask your health care provider about:  The right schedule for you to have regular tests and exams.  Things you can do on your own to prevent diseases and keep yourself healthy. What should I know about diet, weight, and exercise? Eat a healthy diet   Eat a diet that includes plenty of vegetables, fruits, low-fat dairy products, and lean protein.  Do not eat a lot of foods that are high in solid fats, added sugars, or sodium. Maintain a healthy weight Body mass index (BMI) is used to identify weight problems. It estimates body fat based on height and weight. Your health care provider can help determine your BMI and help you achieve or maintain a healthy weight. Get regular exercise Get regular exercise. This is one of the most important things you can do for your health. Most adults should:  Exercise for at least 150 minutes each week. The exercise should increase your heart rate and make you sweat (moderate-intensity exercise).  Do strengthening exercises at least twice a week. This is in addition to the moderate-intensity exercise.  Spend less time sitting. Even light physical activity can be beneficial. Watch cholesterol and blood lipids Have your blood tested for lipids and cholesterol at 57 years of age, then have this test every 5 years. Have your  cholesterol levels checked more often if:  Your lipid or cholesterol levels are high.  You are older than 57 years of age.  You are at high risk for heart disease. What should I know about cancer screening? Depending on your health history and family history, you may need to have cancer screening at various ages. This may include screening for:  Breast cancer.  Cervical cancer.  Colorectal cancer.  Skin cancer.  Lung cancer. What should I know about heart disease, diabetes, and high blood pressure? Blood pressure and heart disease  High blood pressure causes heart disease and increases the risk of stroke. This is more likely to develop in people who have high blood pressure readings, are of African descent, or are overweight.  Have your blood pressure checked: ? Every 3-5 years if you are 64-26 years of age. ? Every year if you are 48 years old or older. Diabetes Have regular diabetes screenings. This checks your fasting blood sugar level. Have the screening done:  Once every three years after age 6 if you are at a normal weight and have a low risk for diabetes.  More often and at a younger age if you are overweight or have a high risk for diabetes. What should I know about preventing infection? Hepatitis B If you have a higher risk for hepatitis B, you should be screened for this virus. Talk with your health care provider to find out if you are at risk for hepatitis B infection. Hepatitis C Testing is recommended for:  Everyone born from [birth year] through 1963.  Anyone  with known risk factors for hepatitis C. Sexually transmitted infections (STIs)  Get screened for STIs, including gonorrhea and chlamydia, if: ? You are sexually active and are younger than 57 years of age. ? You are older than 57 years of age and your health care provider tells you that you are at risk for this type of infection. ? Your sexual activity has changed since you were last screened, and you are  at increased risk for chlamydia or gonorrhea. Ask your health care provider if you are at risk.  Ask your health care provider about whether you are at high risk for HIV. Your health care provider may recommend a prescription medicine to help prevent HIV infection. If you choose to take medicine to prevent HIV, you should first get tested for HIV. You should then be tested every 3 months for as long as you are taking the medicine. Pregnancy  If you are about to stop having your period (premenopausal) and you may become pregnant, seek counseling before you get pregnant.  Take 400 to 800 micrograms (mcg) of folic acid every day if you become pregnant.  Ask for birth control (contraception) if you want to prevent pregnancy. Osteoporosis and menopause Osteoporosis is a disease in which the bones lose minerals and strength with aging. This can result in bone fractures. If you are 22 years old or older, or if you are at risk for osteoporosis and fractures, ask your health care provider if you should:  Be screened for bone loss.  Take a calcium or vitamin D supplement to lower your risk of fractures.  Be given hormone replacement therapy (HRT) to treat symptoms of menopause. Follow these instructions at home: Lifestyle  Do not use any products that contain nicotine or tobacco, such as cigarettes, e-cigarettes, and chewing tobacco. If you need help quitting, ask your health care provider.  Do not use street drugs.  Do not share needles.  Ask your health care provider for help if you need support or information about quitting drugs. Alcohol use  Do not drink alcohol if: ? Your health care provider tells you not to drink. ? You are pregnant, may be pregnant, or are planning to become pregnant.  If you drink alcohol: ? Limit how much you use to 0-1 drink a day. ? Limit intake if you are breastfeeding.  Be aware of how much alcohol is in your drink. In the [place] one drink equals one 12 oz  bottle of beer (355 mL), one 5 oz glass of wine (148 mL), or one 1 oz glass of hard liquor (44 mL). General instructions  Schedule regular health, dental, and eye exams.  Stay current with your vaccines.  Tell your health care provider if: ? You often feel depressed. ? You have ever been abused or do not feel safe at home. Summary  Adopting a healthy lifestyle and getting preventive care are important in promoting health and wellness.  Follow your health care provider's instructions about healthy diet, exercising, and getting tested or screened for diseases.  Follow your health care provider's instructions on monitoring your cholesterol and blood pressure. This information is not intended to replace advice given to you by your health care provider. Make sure you discuss any questions you have with your health care provider. Document Revised: 01/31/2018 Document Reviewed: 01/31/2018 Elsevier Patient Education  2020 [employer].

## 2019-08-15 NOTE — Progress Notes (Signed)
Subjective:    Patient ID: Brenda Gomez, female    DOB: Oct 12, 1960, 59 y.o.   MRN: [id number]  HPI She is here for a physical exam.   She did have covid in may.  She has been vaccinated.  She got covid just after the second vaccine, but had been exposed prior to it.   She has right shoulder pain. It is mild.    She feels more achy.  Her skin is dryer.   Medications and allergies reviewed with patient and updated if appropriate.  Patient Active Problem List   Diagnosis Date Noted   Osteoporosis 08/15/2018   Genetic testing 05/30/2016   Family history of Lynch syndrome 05/09/2016   Family history of colon cancer    Family history of kidney cancer    History of CVA (cerebrovascular accident) 12/02/2008   PARESTHESIA 08/20/2008   Prediabetes 08/20/2008   Essential hypertension 08/16/2007   NEPHROLITHIASIS, HX OF 08/16/2007   HYPERLIPIDEMIA 04/06/2007    Current Outpatient Medications on File Prior to Visit  Medication Sig Dispense Refill   alendronate (FOSAMAX) 70 MG tablet Take 70 mg by mouth once a week. Take with a full glass of water on an empty stomach.     aspirin 81 MG tablet Take 1 tablet (81 mg total) by mouth daily. 30 tablet    Calcium Carbonate (CALTRATE 600 PO) Take by mouth. Reported on 06/04/2015     cetirizine (ZYRTEC) 10 MG tablet Take 10 mg by mouth every evening.     cholecalciferol (VITAMIN D) 1000 units tablet Take 1 tablet (1,000 Units total) by mouth daily.     diltiazem (CARDIZEM) 90 MG tablet Take 1 tablet (90 mg total) by mouth 2 (two) times daily. 180 tablet 1   fish oil-omega-3 fatty acids 1000 MG capsule Take 1 g by mouth 2 (two) times daily.      gabapentin (NEURONTIN) 100 MG capsule Take 200 mg by mouth at bedtime.  5   gabapentin (NEURONTIN) 300 MG capsule Take 300 mg by mouth at bedtime as needed.     lisinopril (ZESTRIL) 40 MG tablet TAKE 1/2 TABLET BY MOUTH TWICE DAILY Must keep schedule appt for refills 30 tablet 0     Multiple Vitamin (MULITIVITAMIN WITH MINERALS) TABS Take 1 tablet by mouth daily.     Potassium Citrate 15 MEQ (1620 MG) TBCR Take 1 tablet by mouth 2 (two) times daily.  11   pravastatin (PRAVACHOL) 20 MG tablet Take 1 tablet (20 mg total) by mouth daily. 90 tablet 1   No current facility-administered medications on file prior to visit.    Past Medical History:  Diagnosis Date   Allergic rhinitis    Allergy    Blood transfusion without reported diagnosis    Dyslipidemia    Family history of colon cancer    Family history of kidney cancer    Family history of Lynch syndrome    Hand tingling left hand , occasional   Headache(784.0)    Dr Jannifer Franklin Rxed Nortriptyline   History of cerebral embolism with infarction-  3 yrs ago,  age 64   residual left hand occasional numb/ tingle-- NEUROLOGIST  DR Jannifer Franklin-- LAST NOTE 02-25-2009 W/ CHART   History of cerebrovascular accident with residual effects    History of kidney stones    Dr Junious Silk   Hypertension    PFO (patent foramen ovale) small- per Dr  Jannifer Franklin, positive transcranial bubble study, and tee echo   Renal cyst  left   Stroke [facility] [place]) 2010    Past Surgical History:  Procedure Laterality Date   APPENDECTOMY  2003   CARDIOVASCULAR STRESS TEST  2004   NORMAL   CYSTOSCOPY W/ RETROGRADES  03/24/2011   Procedure: CYSTOSCOPY WITH RETROGRADE PYELOGRAM;  Surgeon: Fredricka Bonine, MD;  Location: [facility]  Service: Urology;  Laterality: Right;  RT RPG, RT URETERAL STENT, BLADDER BIOPSY AND FULGERATION    CYSTOSCOPY WITH RETROGRADE PYELOGRAM, URETEROSCOPY AND STENT PLACEMENT Right 07/11/2016   Procedure: CYSTOSCOPY, URETEROSCOPY AND RIGHT STENT PLACEMENT;  Surgeon: Irine Seal, MD;  Location: [facility];  Service: Urology;  Laterality: Right;   [place] X2  , 2013   LEFT X 2; R X 1. Dr Junious Silk, [facility]   NEPHROLITHOTOMY  1998   LEFT KIDNEY STONE    TRANSCRANIAL DOPPLER  11-20-2008   POSITIVE BUBBLE STUDY - SMALL INTRACARDIAC RIGHT TO LEFT SHUNT   TRANSESOPHAGEAL ECHOCARDIOGRAM  01-19-2009   SMALL PFO/ NO SIGNIFICANT VALVULAR DISEASE/ EF 60-65%   WISDOM TOOTH EXTRACTION      Social History   Socioeconomic History   Marital status: Married    Spouse name: Not on file   Number of children: Not on file   Years of education: Not on file   Highest education level: Not on file  Occupational History   Not on file  Tobacco Use   Smoking status: Former Smoker    Packs/day: 0.25    Years: 20.00    Pack years: 5.00    Types: Cigarettes    Quit date: 03/22/2010    Years since quitting: 9.4   Smokeless tobacco: Never Used   Tobacco comment: smoked 1980- 2012, up to 1 pp WEEK  Substance and Sexual Activity   Alcohol use: No    Alcohol/week: 0.0 standard drinks   Drug use: No   Sexual activity: Not on file  Other Topics Concern   Not on file  Social History Narrative   Exercise: doing Pur bar   Social Determinants of Health   Financial Resource Strain:    Difficulty of Paying Living Expenses:   Food Insecurity:    Worried About Charity fundraiser in the Last Year:    Arboriculturist in the Last Year:   Transportation Needs:    Film/video editor (Medical):    Lack of Transportation (Non-Medical):   Physical Activity:    Days of Exercise per Week:    Minutes of Exercise per Session:   Stress:    Feeling of Stress :   Social Connections:    Frequency of Communication with Friends and Family:    Frequency of Social Gatherings with Friends and Family:    Attends Religious Services:    Active Member of Clubs or Organizations:    Attends Music therapist:    Marital Status:     Family History  Problem Relation Age of Onset   Diabetes Mother    Stroke Mother 39   Nephrolithiasis Mother    Cancer Sister 8       sarcoma; maternal half sister   Diabetes Maternal Aunt      Kidney cancer Maternal Aunt        dx in her 6s   Diabetes Maternal Grandmother    Heart attack Maternal Grandmother        > 57   Cancer Maternal Grandfather        melanoma with pulmonary  mets   Brain cancer Other        MGF's sister   Colon cancer Brother 6       maternal half brother   Lung cancer Paternal Uncle        smoker   Lymphoma Paternal Grandmother        hodgkins   Heart attack Paternal Grandfather    Colon cancer Cousin        maternal first cousin dx in her 66s   Cancer Other        NOS; MGFs brother   Colon polyps Neg Hx    Esophageal cancer Neg Hx    Rectal cancer Neg Hx    Stomach cancer Neg Hx     Review of Systems  Constitutional: Negative for chills and fever.  Eyes: Negative for visual disturbance.  Respiratory: Negative for cough, shortness of breath and wheezing.   Cardiovascular: Negative for chest pain, palpitations and leg swelling.  Gastrointestinal: Positive for constipation. Negative for abdominal pain, blood in stool, diarrhea and nausea.       No gerd  Genitourinary: Negative for dysuria and hematuria.  Musculoskeletal: Positive for arthralgias (right shoulder). Negative for back pain.  Skin: Negative for color change and rash.  Neurological: Positive for headaches. Negative for light-headedness.  Psychiatric/Behavioral: The patient is not nervous/anxious.        Objective:   Vitals:   08/16/19 0755  BP: 114/78  Pulse: 77  Temp: 97.9 F (36.6 C)  SpO2: 95%   Filed Weights   08/16/19 0755  Weight: 134 lb (60.8 kg)   Body mass index is 26.17 kg/m.  BP Readings from Last 3 Encounters:  08/16/19 114/78  08/15/18 108/60  03/26/18 122/84    Wt Readings from Last 3 Encounters:  08/16/19 134 lb (60.8 kg)  08/15/18 133 lb (60.3 kg)  03/26/18 134 lb 12.8 oz (61.1 kg)     Physical Exam Constitutional: She appears well-developed and well-nourished. No distress.  HENT:  Head: Normocephalic and atraumatic.   Right Ear: External ear normal. Normal ear canal and TM Left Ear: External ear normal.  Normal ear canal and TM Mouth/Throat: Oropharynx is clear and moist.  Eyes: Conjunctivae and EOM are normal.  Neck: Neck supple. No tracheal deviation present. No thyromegaly present.  No carotid bruit  Cardiovascular: Normal rate, regular rhythm and normal heart sounds.   No murmur heard.  No edema. Pulmonary/Chest: Effort normal and breath sounds normal. No respiratory distress. She has no wheezes. She has no rales.  Breast: deferred   Abdominal: Soft. She exhibits no distension. There is no tenderness.  Lymphadenopathy: She has no cervical adenopathy.  Skin: Skin is warm and dry. She is not diaphoretic.  Psychiatric: She has a normal mood and affect. Her behavior is normal.        Assessment & Plan:   Physical exam: Screening blood work    ordered Immunizations  Discussed shingrix, others up to date Colonoscopy  Up to date  Mammogram  Up to date  Gyn  Up to date  DEXA   Due - managed by gyn Eye exams  Up to date  Exercise  Irregular - walks 3 miles a day Weight  Mildly overweight Substance abuse  none  See Problem List for Assessment and Plan of chronic medical problems.   This visit occurred during the SARS-CoV-2 public health emergency.  Safety protocols were in place, including screening questions prior to the visit, additional usage of staff PPE,  and extensive cleaning of exam room while observing appropriate contact time as indicated for disinfecting solutions.

## 2019-08-15 NOTE — Assessment & Plan Note (Addendum)
Chronic Managed by gyn  On fosamax Taking vitamin d, calcium continue walking

## 2019-08-16 ENCOUNTER — Other Ambulatory Visit: Payer: Self-pay

## 2019-08-16 ENCOUNTER — Encounter: Payer: Self-pay | Admitting: Internal Medicine

## 2019-08-16 ENCOUNTER — Ambulatory Visit (INDEPENDENT_AMBULATORY_CARE_PROVIDER_SITE_OTHER): Payer: BC Managed Care – PPO | Admitting: Internal Medicine

## 2019-08-16 VITALS — BP 114/78 | HR 77 | Temp 97.9°F | Ht 60.0 in | Wt 134.0 lb

## 2019-08-16 DIAGNOSIS — Z23 Encounter for immunization: Secondary | ICD-10-CM | POA: Diagnosis not present

## 2019-08-16 DIAGNOSIS — I10 Essential (primary) hypertension: Secondary | ICD-10-CM | POA: Diagnosis not present

## 2019-08-16 DIAGNOSIS — E782 Mixed hyperlipidemia: Secondary | ICD-10-CM | POA: Diagnosis not present

## 2019-08-16 DIAGNOSIS — M810 Age-related osteoporosis without current pathological fracture: Secondary | ICD-10-CM

## 2019-08-16 DIAGNOSIS — Z0000 Encounter for general adult medical examination without abnormal findings: Secondary | ICD-10-CM

## 2019-08-16 DIAGNOSIS — R7303 Prediabetes: Secondary | ICD-10-CM

## 2019-08-16 LAB — CBC WITH DIFFERENTIAL/PLATELET
Basophils Absolute: 0 10*3/uL (ref 0.0–0.1)
Basophils Relative: 0.6 % (ref 0.0–3.0)
Eosinophils Absolute: 0.2 10*3/uL (ref 0.0–0.7)
Eosinophils Relative: 4.1 % (ref 0.0–5.0)
HCT: 39.6 % (ref 36.0–46.0)
Hemoglobin: 13.4 g/dL (ref 12.0–15.0)
Lymphocytes Relative: 42.4 % (ref 12.0–46.0)
Lymphs Abs: 2.2 10*3/uL (ref 0.7–4.0)
MCHC: 33.9 g/dL (ref 30.0–36.0)
MCV: 88.2 fl (ref 78.0–100.0)
Monocytes Absolute: 0.4 10*3/uL (ref 0.1–1.0)
Monocytes Relative: 8.4 % (ref 3.0–12.0)
Neutro Abs: 2.3 10*3/uL (ref 1.4–7.7)
Neutrophils Relative %: 44.5 % (ref 43.0–77.0)
Platelets: 227 10*3/uL (ref 150.0–400.0)
RBC: 4.49 Mil/uL (ref 3.87–5.11)
RDW: 13 % (ref 11.5–15.5)
WBC: 5.3 10*3/uL (ref 4.0–10.5)

## 2019-08-16 LAB — COMPREHENSIVE METABOLIC PANEL
ALT: 21 U/L (ref 0–35)
AST: 18 U/L (ref 0–37)
Albumin: 4.7 g/dL (ref 3.5–5.2)
Alkaline Phosphatase: 49 U/L (ref 39–117)
BUN: 28 mg/dL — ABNORMAL HIGH (ref 6–23)
CO2: 25 mEq/L (ref 19–32)
Calcium: 9.7 mg/dL (ref 8.4–10.5)
Chloride: 102 mEq/L (ref 96–112)
Creatinine, Ser: 0.79 mg/dL (ref 0.40–1.20)
GFR: 75.07 mL/min (ref >60.00)
Glucose, Bld: 96 mg/dL (ref 70–99)
Potassium: 4.5 mEq/L (ref 3.5–5.1)
Sodium: 140 mEq/L (ref 135–145)
Total Bilirubin: 0.3 mg/dL (ref 0.2–1.2)
Total Protein: 7.1 g/dL (ref 6.0–8.3)

## 2019-08-16 LAB — LIPID PANEL
Cholesterol: 200 mg/dL (ref 0–200)
HDL: 80.4 mg/dL (ref >39.00)
LDL Cholesterol: 108 mg/dL — ABNORMAL HIGH (ref 0–99)
NonHDL: 119.2
Total CHOL/HDL Ratio: 2
Triglycerides: 58 mg/dL (ref 0.0–149.0)
VLDL: 11.6 mg/dL (ref 0.0–40.0)

## 2019-08-16 LAB — HEMOGLOBIN A1C: Hgb A1c MFr Bld: 5.6 % (ref 4.6–6.5)

## 2019-08-16 LAB — TSH: TSH: 1.21 u[IU]/mL (ref 0.35–4.50)

## 2019-08-16 LAB — VITAMIN D 25 HYDROXY (VIT D DEFICIENCY, FRACTURES): VITD: 65.76 ng/mL (ref 30.00–100.00)

## 2019-08-16 MED ORDER — DILTIAZEM HCL 90 MG PO TABS: 90.0000 mg | ORAL_TABLET | Freq: Two times a day (BID) | ORAL | 3 refills | Status: DC

## 2019-08-16 MED ORDER — LISINOPRIL 40 MG PO TABS: ORAL_TABLET | ORAL | 3 refills | Status: DC

## 2019-08-16 MED ORDER — PRAVASTATIN SODIUM 20 MG PO TABS: 20.0000 mg | ORAL_TABLET | Freq: Every day | ORAL | 3 refills | Status: DC

## 2019-08-16 MED ORDER — METFORMIN HCL 500 MG PO TABS
500.0000 mg | ORAL_TABLET | Freq: Two times a day (BID) | ORAL | 3 refills | Status: DC
Start: 2019-08-16 — End: 2020-08-03

## 2019-08-16 NOTE — Assessment & Plan Note (Signed)
Chronic BP well controlled Current regimen effective and well tolerated Continue current medications at current doses cmp  

## 2019-08-16 NOTE — Assessment & Plan Note (Addendum)
Chronic Check a1c Low sugar / carb diet Stressed regular exercise Will start metformin for sugars and to help promote weight loss

## 2019-08-16 NOTE — Addendum Note (Signed)
Addended by: Marcina Millard on: 08/16/2019 08:56 AM   Modules accepted: Orders

## 2019-08-16 NOTE — Assessment & Plan Note (Signed)
Chronic Check lipid panel  Continue daily statin Regular exercise and healthy diet encouraged  

## 2019-12-09 DIAGNOSIS — Z01419 Encounter for gynecological examination (general) (routine) without abnormal findings: Secondary | ICD-10-CM | POA: Diagnosis not present

## 2019-12-09 DIAGNOSIS — Z1231 Encounter for screening mammogram for malignant neoplasm of breast: Secondary | ICD-10-CM | POA: Diagnosis not present

## 2019-12-09 DIAGNOSIS — Z13820 Encounter for screening for osteoporosis: Secondary | ICD-10-CM | POA: Diagnosis not present

## 2019-12-09 DIAGNOSIS — Z6827 Body mass index (BMI) 27.0-27.9, adult: Secondary | ICD-10-CM | POA: Diagnosis not present

## 2019-12-16 ENCOUNTER — Other Ambulatory Visit: Payer: Self-pay

## 2019-12-16 ENCOUNTER — Ambulatory Visit (INDEPENDENT_AMBULATORY_CARE_PROVIDER_SITE_OTHER): Payer: BC Managed Care – PPO | Admitting: *Deleted

## 2019-12-16 DIAGNOSIS — Z23 Encounter for immunization: Secondary | ICD-10-CM

## 2019-12-18 ENCOUNTER — Telehealth: Payer: Self-pay | Admitting: Internal Medicine

## 2019-12-18 MED ORDER — DENAVIR 1 % EX CREA: 1.0000 "application " | TOPICAL_CREAM | CUTANEOUS | 2 refills | Status: DC

## 2019-12-18 NOTE — Telephone Encounter (Signed)
Patient states she has a herpetic sore on her nose and when she used to see Dr. Linna Darner he would prescriber her denavir cream for this and was wondering if she could get that sent in for her

## 2019-12-18 NOTE — Telephone Encounter (Signed)
Sent to [facility]

## 2019-12-18 NOTE — Telephone Encounter (Signed)
Spoke with patient and info given 

## 2020-08-02 ENCOUNTER — Other Ambulatory Visit: Payer: Self-pay | Admitting: Internal Medicine

## 2020-08-17 ENCOUNTER — Other Ambulatory Visit: Payer: Self-pay | Admitting: Internal Medicine

## 2020-09-07 ENCOUNTER — Other Ambulatory Visit: Payer: Self-pay | Admitting: Internal Medicine

## 2020-09-24 DIAGNOSIS — N200 Calculus of kidney: Secondary | ICD-10-CM | POA: Diagnosis not present

## 2020-09-24 DIAGNOSIS — R82998 Other abnormal findings in urine: Secondary | ICD-10-CM | POA: Diagnosis not present

## 2020-11-19 ENCOUNTER — Other Ambulatory Visit: Payer: Self-pay | Admitting: Nurse Practitioner

## 2020-11-19 ENCOUNTER — Other Ambulatory Visit (INDEPENDENT_AMBULATORY_CARE_PROVIDER_SITE_OTHER): Payer: BC Managed Care – PPO

## 2020-11-19 ENCOUNTER — Telehealth (INDEPENDENT_AMBULATORY_CARE_PROVIDER_SITE_OTHER): Payer: BC Managed Care – PPO | Admitting: Nurse Practitioner

## 2020-11-19 ENCOUNTER — Encounter: Payer: Self-pay | Admitting: Nurse Practitioner

## 2020-11-19 DIAGNOSIS — J019 Acute sinusitis, unspecified: Secondary | ICD-10-CM

## 2020-11-19 LAB — COMPREHENSIVE METABOLIC PANEL
ALT: 15 U/L (ref 0–35)
AST: 15 U/L (ref 0–37)
Albumin: 4.5 g/dL (ref 3.5–5.2)
Alkaline Phosphatase: 46 U/L (ref 39–117)
BUN: 16 mg/dL (ref 6–23)
CO2: 29 mEq/L (ref 19–32)
Calcium: 9.3 mg/dL (ref 8.4–10.5)
Chloride: 100 mEq/L (ref 96–112)
Creatinine, Ser: 0.82 mg/dL (ref 0.40–1.20)
GFR: 79.05 mL/min (ref >60.00)
Glucose, Bld: 81 mg/dL (ref 70–99)
Potassium: 4 mEq/L (ref 3.5–5.1)
Sodium: 138 mEq/L (ref 135–145)
Total Bilirubin: 0.3 mg/dL (ref 0.2–1.2)
Total Protein: 7.2 g/dL (ref 6.0–8.3)

## 2020-11-19 LAB — CBC
HCT: 40.1 % (ref 36.0–46.0)
Hemoglobin: 13.4 g/dL (ref 12.0–15.0)
MCHC: 33.4 g/dL (ref 30.0–36.0)
MCV: 89.2 fl (ref 78.0–100.0)
Platelets: 303 10*3/uL (ref 150.0–400.0)
RBC: 4.49 Mil/uL (ref 3.87–5.11)
RDW: 12.7 % (ref 11.5–15.5)
WBC: 6.6 10*3/uL (ref 4.0–10.5)

## 2020-11-19 MED ORDER — AZITHROMYCIN 250 MG PO TABS: ORAL_TABLET | ORAL | 0 refills | Status: AC

## 2020-11-19 MED ORDER — BENZONATATE 200 MG PO CAPS: 200.0000 mg | ORAL_CAPSULE | Freq: Two times a day (BID) | ORAL | 0 refills | Status: DC | PRN

## 2020-11-19 MED ORDER — FLUTICASONE PROPIONATE 50 MCG/ACT NA SUSP: 2.0000 | Freq: Every day | NASAL | 6 refills | Status: DC

## 2020-11-19 NOTE — Progress Notes (Signed)
Due to national recommendations of social distancing related to the [place] pandemic, an audio-only tele-health visit was felt to be the most appropriate encounter type for this patient today. I connected with  Brenda Gomez on 11/19/20 utilizing audio-only technology and verified that I am speaking with the correct person using two identifiers. The patient was located at their place of employment, and I was located at the office of [facility] during the encounter. I discussed the limitations of evaluation and management by telemedicine. The patient expressed understanding and agreed to proceed.     Subjective:  Patient ID: Brenda Gomez, female    DOB: [birth year]  Age: 60 y.o. MRN: [id number]  CC:  Chief Complaint  Patient presents with   Sinusitis       HPI  This patient arrives today for a virtual visit for the above.  She tells me approximately 3 days ago she started coughing, she was exposed to her son who had symptoms prior to her.  She took a COVID-19 test today which was negative at home.  She has been fully vaccinated against COVID-19.  She tells me her son did not take a COVID-19 test.  She tells me now that the symptoms have progressed to head congestion, frequent dry cough, and sinus pain.  She denies any shortness of breath or chest pain.  She has tried Human resources officer and cough drops over-the-counter.  Her main concern is getting a cough suppressant because she is having coughing attacks while she is at work.  She denies fever.  Past Medical History:  Diagnosis Date   Allergic rhinitis    Allergy    Blood transfusion without reported diagnosis    Dyslipidemia    Family history of colon cancer    Family history of kidney cancer    Family history of Lynch syndrome    Hand tingling left hand , occasional   Headache(784.0)    Dr Jannifer Franklin Rxed Nortriptyline   History of cerebral embolism with infarction-  3 yrs ago,  age 44   residual left hand  occasional numb/ tingle-- NEUROLOGIST  DR Jannifer Franklin-- LAST NOTE 02-25-2009 W/ CHART   History of cerebrovascular accident with residual effects    History of kidney stones    Dr Junious Silk   Hypertension    PFO (patent foramen ovale) small- per Dr  Jannifer Franklin, positive transcranial bubble study, and tee echo   Renal cyst left   Stroke ([place]) 2010      Family History  Problem Relation Age of Onset   Diabetes Mother    Stroke Mother 33   Nephrolithiasis Mother    Cancer Sister 8       sarcoma; maternal half sister   Diabetes Maternal Aunt    Kidney cancer Maternal Aunt        dx in her 81s   Diabetes Maternal Grandmother    Heart attack Maternal Grandmother        > 1   Cancer Maternal Grandfather        melanoma with pulmonary mets   Brain cancer Other        MGF's sister   Colon cancer Brother 1       maternal half brother   Lung cancer Paternal Uncle        smoker   Lymphoma Paternal Grandmother        hodgkins   Heart attack Paternal Grandfather    Colon cancer Cousin  maternal first cousin dx in her 63s   Cancer Other        NOS; MGFs brother   Colon polyps Neg Hx    Esophageal cancer Neg Hx    Rectal cancer Neg Hx    Stomach cancer Neg Hx     Social History   Social History Narrative   Exercise: doing Pur bar   Social History   Tobacco Use   Smoking status: Former    Packs/day: 0.25    Years: 20.00    Pack years: 5.00    Types: Cigarettes    Quit date: 03/22/2010    Years since quitting: 10.6   Smokeless tobacco: Never   Tobacco comments:    smoked 1980- 2012, up to 1 pp WEEK  Substance Use Topics   Alcohol use: No    Alcohol/week: 0.0 standard drinks     Current Meds  Medication Sig   alendronate (FOSAMAX) 70 MG tablet Take 70 mg by mouth once a week. Take with a full glass of water on an empty stomach.   aspirin 81 MG tablet Take 1 tablet (81 mg total) by mouth daily.   benzonatate (TESSALON) 200 MG capsule Take 1 capsule (200 mg total) by  mouth 2 (two) times daily as needed for cough.   Calcium Carbonate (CALTRATE 600 PO) Take by mouth. Reported on 06/04/2015   cetirizine (ZYRTEC) 10 MG tablet Take 10 mg by mouth every evening.   cholecalciferol (VITAMIN D) 1000 units tablet Take 1 tablet (1,000 Units total) by mouth daily.   diltiazem (CARDIZEM) 90 MG tablet TAKE 1 TABLET (90 MG TOTAL) BY MOUTH 2 (TWO) TIMES DAILY.   fish oil-omega-3 fatty acids 1000 MG capsule Take 1 g by mouth 2 (two) times daily.    fluticasone (FLONASE) 50 MCG/ACT nasal spray Place 2 sprays into both nostrils daily.   gabapentin (NEURONTIN) 300 MG capsule Take 300 mg by mouth at bedtime as needed.   lisinopril (ZESTRIL) 40 MG tablet TAKE 1/2 TABLET BY MOUTH TWICE A DAY   metFORMIN (GLUCOPHAGE) 500 MG tablet TAKE 1 TABLET BY MOUTH TWICE A DAY WITH A MEAL   Multiple Vitamin (MULITIVITAMIN WITH MINERALS) TABS Take 1 tablet by mouth daily.   penciclovir (DENAVIR) 1 % cream Apply 1 application topically every 2 (two) hours.   Potassium Citrate 15 MEQ (1620 MG) TBCR Take 1 tablet by mouth 2 (two) times daily.   pravastatin (PRAVACHOL) 20 MG tablet TAKE 1 TABLET BY MOUTH EVERY DAY   [DISCONTINUED] gabapentin (NEURONTIN) 100 MG capsule Take 200 mg by mouth at bedtime.    ROS:  Review of Systems  Constitutional:  Negative for chills, fever and malaise/fatigue.  HENT:  Positive for congestion, sinus pain and sore throat (has improved).   Respiratory:  Positive for cough. Negative for sputum production, shortness of breath and wheezing.   Cardiovascular:  Negative for chest pain.  Gastrointestinal:  Negative for abdominal pain, blood in stool and diarrhea.  Neurological:  Positive for headaches.    Objective:   Today's Vitals: There were no vitals taken for this visit. Vitals with BMI 08/16/2019 08/15/2018 03/26/2018  Height 5' 0" 5' 0" 5' 0"  Weight 134 lbs 133 lbs 134 lbs 13 oz  BMI 26.17 46.80 32.12  Systolic 248 250 037  Diastolic 78 60 84  Pulse 77 54 78      Physical Exam Comprehensive physical exam not completed today as office visit was conducted remotely.  Patient sounded well over  the phone, she was able to speak in complete sentences without having to stop to breathe, no cough noted during visit.  Patient was alert and oriented, and appeared to have appropriate judgment.       Assessment and Plan   1. Acute non-recurrent sinusitis, unspecified location      Plan: 1.  We will treat her empirically for sinusitis.  I recommended she avoid antibiotics for now as 95% of the time sinus infections are viral.  She will come by later today to have metabolic panel and complete blood count checked.  If kidney function remains stable and creatinine clearance is greater than 10 will prescribe a Z-Pak that she can take only if symptoms worsen between now and Monday.  She tells me he understands.  We will prescribe Flonase nasal spray as well as Tessalon Perles that she can take as needed for symptoms.  I also recommend she consider taking over-the-counter Mucinex as needed for her symptoms.  She tells me she understands.  She was encouraged to consider testing for COVID-19 again in about 24 to 48 hours as there is a risk of false negative test results with the at home test.  She tells me she understands.   Tests ordered Orders Placed This Encounter  Procedures   Comp Met (CMET)   CBC      Meds ordered this encounter  Medications   fluticasone (FLONASE) 50 MCG/ACT nasal spray    Sig: Place 2 sprays into both nostrils daily.    Dispense:  16 g    Refill:  6    Order Specific Question:   Supervising Provider    Answer:   BURNS, Claudina Lick [[id number]]   benzonatate (TESSALON) 200 MG capsule    Sig: Take 1 capsule (200 mg total) by mouth 2 (two) times daily as needed for cough.    Dispense:  20 capsule    Refill:  0    Order Specific Question:   Supervising Provider    Answer:   Binnie Rail [id number]    Patient to follow-up as needed.   Total time spent with phone today was 18 minutes and 57 seconds.  Ailene Ards, NP

## 2020-12-02 ENCOUNTER — Other Ambulatory Visit: Payer: Self-pay | Admitting: Internal Medicine

## 2020-12-02 DIAGNOSIS — M810 Age-related osteoporosis without current pathological fracture: Secondary | ICD-10-CM

## 2020-12-05 ENCOUNTER — Other Ambulatory Visit: Payer: Self-pay | Admitting: Internal Medicine

## 2021-02-08 DIAGNOSIS — Z6826 Body mass index (BMI) 26.0-26.9, adult: Secondary | ICD-10-CM | POA: Diagnosis not present

## 2021-02-08 DIAGNOSIS — G4720 Circadian rhythm sleep disorder, unspecified type: Secondary | ICD-10-CM | POA: Insufficient documentation

## 2021-02-08 DIAGNOSIS — Z01419 Encounter for gynecological examination (general) (routine) without abnormal findings: Secondary | ICD-10-CM | POA: Diagnosis not present

## 2021-02-08 DIAGNOSIS — Z1231 Encounter for screening mammogram for malignant neoplasm of breast: Secondary | ICD-10-CM | POA: Diagnosis not present

## 2021-03-09 ENCOUNTER — Other Ambulatory Visit: Payer: Self-pay | Admitting: Internal Medicine

## 2021-04-13 ENCOUNTER — Encounter (HOSPITAL_COMMUNITY): Payer: Self-pay | Admitting: Urology

## 2021-04-13 ENCOUNTER — Other Ambulatory Visit: Payer: Self-pay | Admitting: Urology

## 2021-04-13 ENCOUNTER — Inpatient Hospital Stay (HOSPITAL_COMMUNITY): Payer: BC Managed Care – PPO | Admitting: Anesthesiology

## 2021-04-13 ENCOUNTER — Encounter (HOSPITAL_COMMUNITY)
Admission: RE | Disposition: A | Discharge status: Home / Self Care | Payer: Self-pay | Source: Ambulatory Visit | Attending: Urology

## 2021-04-13 ENCOUNTER — Observation Stay (HOSPITAL_COMMUNITY)
Admission: RE | Admit: 2021-04-13 | Discharge: 2021-04-14 | Disposition: A | Discharge status: Home / Self Care | Payer: BC Managed Care – PPO | Source: Ambulatory Visit | Attending: Urology | Admitting: Urology

## 2021-04-13 ENCOUNTER — Inpatient Hospital Stay (HOSPITAL_COMMUNITY): Payer: BC Managed Care – PPO

## 2021-04-13 ENCOUNTER — Other Ambulatory Visit: Payer: Self-pay

## 2021-04-13 DIAGNOSIS — N201 Calculus of ureter: Principal | ICD-10-CM | POA: Insufficient documentation

## 2021-04-13 DIAGNOSIS — N200 Calculus of kidney: Secondary | ICD-10-CM | POA: Diagnosis not present

## 2021-04-13 DIAGNOSIS — N261 Atrophy of kidney (terminal): Secondary | ICD-10-CM | POA: Diagnosis not present

## 2021-04-13 DIAGNOSIS — N1330 Unspecified hydronephrosis: Secondary | ICD-10-CM | POA: Diagnosis not present

## 2021-04-13 DIAGNOSIS — I10 Essential (primary) hypertension: Secondary | ICD-10-CM | POA: Insufficient documentation

## 2021-04-13 DIAGNOSIS — R8271 Bacteriuria: Secondary | ICD-10-CM | POA: Diagnosis not present

## 2021-04-13 DIAGNOSIS — Z7982 Long term (current) use of aspirin: Secondary | ICD-10-CM | POA: Insufficient documentation

## 2021-04-13 DIAGNOSIS — M4316 Spondylolisthesis, lumbar region: Secondary | ICD-10-CM | POA: Diagnosis not present

## 2021-04-13 DIAGNOSIS — N281 Cyst of kidney, acquired: Secondary | ICD-10-CM | POA: Diagnosis not present

## 2021-04-13 DIAGNOSIS — N132 Hydronephrosis with renal and ureteral calculous obstruction: Secondary | ICD-10-CM | POA: Diagnosis present

## 2021-04-13 DIAGNOSIS — R509 Fever, unspecified: Secondary | ICD-10-CM | POA: Diagnosis not present

## 2021-04-13 DIAGNOSIS — I700 Atherosclerosis of aorta: Secondary | ICD-10-CM | POA: Diagnosis not present

## 2021-04-13 DIAGNOSIS — Z20822 Contact with and (suspected) exposure to covid-19: Secondary | ICD-10-CM | POA: Insufficient documentation

## 2021-04-13 DIAGNOSIS — Z01818 Encounter for other preprocedural examination: Secondary | ICD-10-CM

## 2021-04-13 DIAGNOSIS — Z87891 Personal history of nicotine dependence: Secondary | ICD-10-CM | POA: Insufficient documentation

## 2021-04-13 DIAGNOSIS — M47816 Spondylosis without myelopathy or radiculopathy, lumbar region: Secondary | ICD-10-CM | POA: Diagnosis not present

## 2021-04-13 HISTORY — DX: Other specified postprocedural states: R11.2

## 2021-04-13 HISTORY — DX: Other complications of anesthesia, initial encounter: T88.59XA

## 2021-04-13 HISTORY — DX: Nausea with vomiting, unspecified: R11.2

## 2021-04-13 HISTORY — PX: CYSTOSCOPY/URETEROSCOPY/HOLMIUM LASER/STENT PLACEMENT: SHX6546

## 2021-04-13 HISTORY — DX: Other specified postprocedural states: Z98.890

## 2021-04-13 LAB — SARS CORONAVIRUS 2 BY RT PCR (HOSPITAL ORDER, PERFORMED IN ~~LOC~~ HOSPITAL LAB): SARS Coronavirus 2: NEGATIVE

## 2021-04-13 LAB — BASIC METABOLIC PANEL
Anion gap: 10 (ref 5–15)
BUN: 27 mg/dL — ABNORMAL HIGH (ref 6–20)
CO2: 22 mmol/L (ref 22–32)
Calcium: 9.2 mg/dL (ref 8.9–10.3)
Chloride: 104 mmol/L (ref 98–111)
Creatinine, Ser: 1.15 mg/dL — ABNORMAL HIGH (ref 0.44–1.00)
GFR, Estimated: 55 mL/min — ABNORMAL LOW (ref >60)
Glucose, Bld: 90 mg/dL (ref 70–99)
Potassium: 3.6 mmol/L (ref 3.5–5.1)
Sodium: 136 mmol/L (ref 135–145)

## 2021-04-13 LAB — GRAM STAIN

## 2021-04-13 LAB — CBC WITH DIFFERENTIAL/PLATELET
Abs Immature Granulocytes: 0.04 10*3/uL (ref 0.00–0.07)
Basophils Absolute: 0 10*3/uL (ref 0.0–0.1)
Basophils Relative: 0 %
Eosinophils Absolute: 0.1 10*3/uL (ref 0.0–0.5)
Eosinophils Relative: 1 %
HCT: 37.3 % (ref 36.0–46.0)
Hemoglobin: 12.7 g/dL (ref 12.0–15.0)
Immature Granulocytes: 0 %
Lymphocytes Relative: 11 %
Lymphs Abs: 1 10*3/uL (ref 0.7–4.0)
MCH: 30.2 pg (ref 26.0–34.0)
MCHC: 34 g/dL (ref 30.0–36.0)
MCV: 88.6 fL (ref 80.0–100.0)
Monocytes Absolute: 0.9 10*3/uL (ref 0.1–1.0)
Monocytes Relative: 9 %
Neutro Abs: 7.3 10*3/uL (ref 1.7–7.7)
Neutrophils Relative %: 79 %
Platelets: 263 10*3/uL (ref 150–400)
RBC: 4.21 MIL/uL (ref 3.87–5.11)
RDW: 12 % (ref 11.5–15.5)
WBC: 9.3 10*3/uL (ref 4.0–10.5)
nRBC: 0 % (ref 0.0–0.2)

## 2021-04-13 LAB — GLUCOSE, CAPILLARY: Glucose-Capillary: 200 mg/dL — ABNORMAL HIGH (ref 70–99)

## 2021-04-13 SURGERY — CYSTOSCOPY/URETEROSCOPY/HOLMIUM LASER/STENT PLACEMENT
Anesthesia: General | Site: Ureter | Laterality: Right

## 2021-04-13 MED ORDER — LIDOCAINE HCL (PF) 2 % IJ SOLN
INTRAMUSCULAR | Status: AC
  Filled 2021-04-13: qty 5

## 2021-04-13 MED ORDER — FENTANYL CITRATE (PF) 100 MCG/2ML IJ SOLN
INTRAMUSCULAR | Status: AC
  Filled 2021-04-13: qty 2

## 2021-04-13 MED ORDER — FENTANYL CITRATE (PF) 100 MCG/2ML IJ SOLN
INTRAMUSCULAR | Status: DC | PRN
  Administered 2021-04-13: 50 ug via INTRAVENOUS

## 2021-04-13 MED ORDER — CHLORHEXIDINE GLUCONATE 0.12 % MT SOLN
15.0000 mL | Freq: Once | OROMUCOSAL | Status: AC
  Administered 2021-04-13: 15 mL via OROMUCOSAL

## 2021-04-13 MED ORDER — LISINOPRIL 20 MG PO TABS
20.0000 mg | ORAL_TABLET | Freq: Every day | ORAL | Status: DC
  Administered 2021-04-14: 20 mg via ORAL
  Filled 2021-04-13: qty 1

## 2021-04-13 MED ORDER — FLUTICASONE PROPIONATE 50 MCG/ACT NA SUSP
2.0000 | Freq: Every day | NASAL | Status: DC
  Filled 2021-04-13: qty 16

## 2021-04-13 MED ORDER — ADULT MULTIVITAMIN W/MINERALS CH
1.0000 | ORAL_TABLET | Freq: Every day | ORAL | Status: DC
  Administered 2021-04-13 – 2021-04-14 (×2): 1 via ORAL
  Filled 2021-04-13 (×2): qty 1

## 2021-04-13 MED ORDER — SODIUM CHLORIDE 0.9 % IR SOLN
Status: DC | PRN
  Administered 2021-04-13: 3000 mL

## 2021-04-13 MED ORDER — PHENYLEPHRINE 40 MCG/ML (10ML) SYRINGE FOR IV PUSH (FOR BLOOD PRESSURE SUPPORT)
PREFILLED_SYRINGE | INTRAVENOUS | Status: AC
  Filled 2021-04-13: qty 10

## 2021-04-13 MED ORDER — ONDANSETRON HCL 4 MG/2ML IJ SOLN
INTRAMUSCULAR | Status: AC
  Filled 2021-04-13: qty 2

## 2021-04-13 MED ORDER — HYDROMORPHONE HCL 1 MG/ML IJ SOLN: 0.5000 mg | INTRAMUSCULAR | Status: DC | PRN

## 2021-04-13 MED ORDER — OXYCODONE HCL 5 MG PO TABS: 5.0000 mg | ORAL_TABLET | Freq: Once | ORAL | Status: DC | PRN

## 2021-04-13 MED ORDER — PRAVASTATIN SODIUM 20 MG PO TABS
20.0000 mg | ORAL_TABLET | Freq: Every day | ORAL | Status: DC
  Administered 2021-04-13: 20 mg via ORAL
  Filled 2021-04-13: qty 1

## 2021-04-13 MED ORDER — OXYCODONE HCL 5 MG/5ML PO SOLN: 5.0000 mg | Freq: Once | ORAL | Status: DC | PRN

## 2021-04-13 MED ORDER — ONDANSETRON HCL 4 MG/2ML IJ SOLN
INTRAMUSCULAR | Status: DC | PRN
  Administered 2021-04-13: 4 mg via INTRAVENOUS

## 2021-04-13 MED ORDER — ACETAMINOPHEN 500 MG PO TABS
1000.0000 mg | ORAL_TABLET | Freq: Once | ORAL | Status: AC
  Administered 2021-04-13: 1000 mg via ORAL
  Filled 2021-04-13: qty 2

## 2021-04-13 MED ORDER — SODIUM CHLORIDE 0.9 % IV SOLN
1.0000 g | INTRAVENOUS | Status: DC
  Administered 2021-04-14: 1 g via INTRAVENOUS
  Filled 2021-04-13: qty 10

## 2021-04-13 MED ORDER — LACTATED RINGERS IV SOLN: INTRAVENOUS | Status: DC

## 2021-04-13 MED ORDER — IOHEXOL 300 MG/ML  SOLN
INTRAMUSCULAR | Status: DC | PRN
  Administered 2021-04-13: 10 mL

## 2021-04-13 MED ORDER — DEXAMETHASONE SODIUM PHOSPHATE 10 MG/ML IJ SOLN
INTRAMUSCULAR | Status: DC | PRN
  Administered 2021-04-13: 8 mg via INTRAVENOUS

## 2021-04-13 MED ORDER — DEXAMETHASONE SODIUM PHOSPHATE 10 MG/ML IJ SOLN
INTRAMUSCULAR | Status: AC
  Filled 2021-04-13: qty 1

## 2021-04-13 MED ORDER — ACETAMINOPHEN 325 MG PO TABS
650.0000 mg | ORAL_TABLET | ORAL | Status: DC | PRN
  Administered 2021-04-14: 650 mg via ORAL
  Filled 2021-04-13: qty 2

## 2021-04-13 MED ORDER — PROPOFOL 10 MG/ML IV BOLUS
INTRAVENOUS | Status: DC | PRN
  Administered 2021-04-13: 130 mg via INTRAVENOUS
  Administered 2021-04-13: 70 mg via INTRAVENOUS

## 2021-04-13 MED ORDER — OXYCODONE HCL 5 MG PO TABS
5.0000 mg | ORAL_TABLET | ORAL | Status: DC | PRN
  Administered 2021-04-13 – 2021-04-14 (×2): 5 mg via ORAL
  Filled 2021-04-13 (×2): qty 1

## 2021-04-13 MED ORDER — PHENYLEPHRINE 40 MCG/ML (10ML) SYRINGE FOR IV PUSH (FOR BLOOD PRESSURE SUPPORT)
PREFILLED_SYRINGE | INTRAVENOUS | Status: DC | PRN
Start: 2021-04-13 — End: 2021-04-13
  Administered 2021-04-13: 80 ug via INTRAVENOUS
  Administered 2021-04-13: 120 ug via INTRAVENOUS
  Administered 2021-04-13: 80 ug via INTRAVENOUS

## 2021-04-13 MED ORDER — SODIUM CHLORIDE 0.9 % IV SOLN: INTRAVENOUS | Status: DC

## 2021-04-13 MED ORDER — HYDROMORPHONE HCL 1 MG/ML IJ SOLN: 0.2500 mg | INTRAMUSCULAR | Status: DC | PRN

## 2021-04-13 MED ORDER — SENNOSIDES-DOCUSATE SODIUM 8.6-50 MG PO TABS
1.0000 | ORAL_TABLET | Freq: Two times a day (BID) | ORAL | Status: DC
  Administered 2021-04-13 – 2021-04-14 (×2): 1 via ORAL
  Filled 2021-04-13 (×2): qty 1

## 2021-04-13 MED ORDER — SODIUM CHLORIDE 0.9 % IV SOLN
2.0000 g | INTRAVENOUS | Status: AC
  Administered 2021-04-13: 2 g via INTRAVENOUS
  Filled 2021-04-13: qty 20

## 2021-04-13 MED ORDER — LORATADINE 10 MG PO TABS
10.0000 mg | ORAL_TABLET | Freq: Every day | ORAL | Status: DC
  Administered 2021-04-13: 10 mg via ORAL
  Filled 2021-04-13: qty 1

## 2021-04-13 MED ORDER — DILTIAZEM HCL 90 MG PO TABS
90.0000 mg | ORAL_TABLET | Freq: Two times a day (BID) | ORAL | Status: DC
  Administered 2021-04-13 – 2021-04-14 (×2): 90 mg via ORAL
  Filled 2021-04-13 (×2): qty 1

## 2021-04-13 MED ORDER — ASPIRIN 81 MG PO CHEW
81.0000 mg | CHEWABLE_TABLET | Freq: Every day | ORAL | Status: DC
  Administered 2021-04-14: 81 mg via ORAL
  Filled 2021-04-13: qty 1

## 2021-04-13 MED ORDER — LIDOCAINE 2% (20 MG/ML) 5 ML SYRINGE
INTRAMUSCULAR | Status: DC | PRN
  Administered 2021-04-13: 60 mg via INTRAVENOUS

## 2021-04-13 MED ORDER — MIDAZOLAM HCL 5 MG/5ML IJ SOLN
INTRAMUSCULAR | Status: DC | PRN
  Administered 2021-04-13: 2 mg via INTRAVENOUS

## 2021-04-13 MED ORDER — INSULIN ASPART 100 UNIT/ML IJ SOLN
0.0000 [IU] | Freq: Three times a day (TID) | INTRAMUSCULAR | Status: DC
  Administered 2021-04-14: 3 [IU] via SUBCUTANEOUS

## 2021-04-13 MED ORDER — PROPOFOL 10 MG/ML IV BOLUS
INTRAVENOUS | Status: AC
  Filled 2021-04-13: qty 20

## 2021-04-13 MED ORDER — MIDAZOLAM HCL 2 MG/2ML IJ SOLN
INTRAMUSCULAR | Status: AC
  Filled 2021-04-13: qty 2

## 2021-04-13 MED ORDER — ONDANSETRON HCL 4 MG/2ML IJ SOLN: 4.0000 mg | Freq: Once | INTRAMUSCULAR | Status: DC | PRN

## 2021-04-13 SURGICAL SUPPLY — 22 items
BAG URO CATCHER STRL LF (MISCELLANEOUS) ×2 IMPLANT
BASKET LASER NITINOL 1.9FR (BASKET) IMPLANT
BSKT STON RTRVL 120 1.9FR (BASKET)
CATH URETL OPEN END 6FR 70 (CATHETERS) ×2 IMPLANT
CLOTH BEACON ORANGE TIMEOUT ST (SAFETY) ×2 IMPLANT
EXTRACTOR STONE 1.7FRX115CM (UROLOGICAL SUPPLIES) IMPLANT
GLOVE SURG ENC TEXT LTX SZ7.5 (GLOVE) ×2 IMPLANT
GOWN STRL REUS W/TWL LRG LVL3 (GOWN DISPOSABLE) ×2 IMPLANT
GUIDEWIRE ANG ZIPWIRE 038X150 (WIRE) ×2 IMPLANT
GUIDEWIRE STR DUAL SENSOR (WIRE) ×2 IMPLANT
KIT TURNOVER KIT A (KITS) IMPLANT
LASER FIB FLEXIVA PULSE ID 365 (Laser) IMPLANT
MANIFOLD NEPTUNE II (INSTRUMENTS) ×2 IMPLANT
PACK CYSTO (CUSTOM PROCEDURE TRAY) ×2 IMPLANT
SHEATH NAVIGATOR HD 11/13X28 (SHEATH) IMPLANT
SHEATH NAVIGATOR HD 11/13X36 (SHEATH) IMPLANT
STENT POLARIS 5FRX24 (STENTS) ×1 IMPLANT
TRACTIP FLEXIVA PULS ID 200XHI (Laser) IMPLANT
TRACTIP FLEXIVA PULSE ID 200 (Laser)
TUBE FEEDING 8FR 16IN STR KANG (MISCELLANEOUS) ×2 IMPLANT
TUBING CONNECTING 10 (TUBING) ×2 IMPLANT
TUBING UROLOGY SET (TUBING) ×2 IMPLANT

## 2021-04-13 NOTE — H&P (Signed)
Brenda Gomez is an 61 y.o. female.    Chief Complaint: Pre-OP Cysto / RIGHT Ureteral Stent Placement  HPI:   1 - Atrophic Left Kidney - completely atrophic left kidney on imaging x several including CT 2018 and 2023  2 - Recurrent Urolithiasis -  2013 - Rt SWL  2022 - KUB Rt 50mm renal, scattered left renal  03/2021 - CT Rt 36mm UVJ stone with mod hydro   3 - Fevers, Tachycardia, Obstructing Pyelonephritis - pt with new fevers to 100.4, tachycardia to low 100s and some small bacteruria on eval 04/13/2021. Receiving rocephin peri-op.  PMH sig for appy, HTN, CVA (no deficits, no strong blood thinners). Her PCP is Brenda Gosling MD   Today "Brenda Gomez" is seen for urgetn stenting for stone in solitary kidney, now with some concern of earl obstructinge pyelo. Last meal 9 am (egg).   Past Medical History:  Diagnosis Date   Allergic rhinitis    Allergy    Blood transfusion without reported diagnosis    Complication of anesthesia    Dyslipidemia    Family history of colon cancer    Family history of kidney cancer    Family history of Lynch syndrome    Hand tingling left hand , occasional   Headache(784.0)    Dr Brenda Gomez Rxed Nortriptyline   History of cerebral embolism with infarction-  3 yrs ago,  age 83   residual left hand occasional numb/ tingle-- NEUROLOGIST  DR Brenda Gomez-- LAST NOTE 02-25-2009 W/ CHART   History of cerebrovascular accident with residual effects    History of kidney stones    Dr Brenda Gomez   Hypertension    PFO (patent foramen ovale) small- per Dr  Brenda Gomez, positive transcranial bubble study, and tee echo   PONV (postoperative nausea and vomiting)    Renal cyst left   Stroke [facility]) 2010    Past Surgical History:  Procedure Laterality Date   APPENDECTOMY  2003   CARDIOVASCULAR STRESS TEST  2004   NORMAL   CYSTOSCOPY W/ RETROGRADES  03/24/2011   Procedure: CYSTOSCOPY WITH RETROGRADE PYELOGRAM;  Surgeon: Fredricka Bonine, MD;  Location: [facility];   Service: Urology;  Laterality: Right;  RT RPG, RT URETERAL STENT, BLADDER BIOPSY AND FULGERATION    CYSTOSCOPY WITH RETROGRADE PYELOGRAM, URETEROSCOPY AND STENT PLACEMENT Right 07/11/2016   Procedure: CYSTOSCOPY, URETEROSCOPY AND RIGHT STENT PLACEMENT;  Surgeon: Irine Seal, MD;  Location: [facility];  Service: Urology;  Laterality: Right;   [place] X2  , 2013   LEFT X 2; R X 1. Dr Brenda Gomez, [facility]   NEPHROLITHOTOMY  1998   LEFT KIDNEY STONE   TRANSCRANIAL DOPPLER  11-20-2008   POSITIVE BUBBLE STUDY - SMALL INTRACARDIAC RIGHT TO LEFT SHUNT   TRANSESOPHAGEAL ECHOCARDIOGRAM  01-19-2009   SMALL PFO/ NO SIGNIFICANT VALVULAR DISEASE/ EF 60-65%   WISDOM TOOTH EXTRACTION      Family History  Problem Relation Age of Onset   Diabetes Mother    Stroke Mother 43   Nephrolithiasis Mother    Cancer Sister 8       sarcoma; maternal half sister   Diabetes Maternal Aunt    Kidney cancer Maternal Aunt        dx in her 76s   Diabetes Maternal Grandmother    Heart attack Maternal Grandmother        > 13   Cancer Maternal Grandfather        melanoma with pulmonary  mets   Brain cancer Other        MGF's sister   Colon cancer Brother 45       maternal half brother   Lung cancer Paternal Uncle        smoker   Lymphoma Paternal Grandmother        hodgkins   Heart attack Paternal Grandfather    Colon cancer Cousin        maternal first cousin dx in her 81s   Cancer Other        NOS; MGFs brother   Colon polyps Neg Hx    Esophageal cancer Neg Hx    Rectal cancer Neg Hx    Stomach cancer Neg Hx    Social History:  reports that she quit smoking about 11 years ago. Her smoking use included cigarettes. She has a 5.00 pack-year smoking history. She has never used smokeless tobacco. She reports that she does not drink alcohol and does not use drugs.  Allergies:  Allergies  Allergen Reactions   Codeine Swelling    Facial edema Because of a history of  documented adverse serious drug reaction;Medi Alert bracelet  is recommended    Medications Prior to Admission  Medication Sig Dispense Refill   alendronate (FOSAMAX) 70 MG tablet TAKE 1 TABLET EVERY WEEK BY ORAL ROUTE IN THE MORNING FOR 30 DAYS. 12 tablet 3   aspirin 81 MG tablet Take 1 tablet (81 mg total) by mouth daily. 30 tablet    Calcium Carbonate (CALTRATE 600 PO) Take by mouth. Reported on 06/04/2015     cetirizine (ZYRTEC) 10 MG tablet Take 10 mg by mouth every evening.     cholecalciferol (VITAMIN D) 1000 units tablet Take 1 tablet (1,000 Units total) by mouth daily.     diltiazem (CARDIZEM) 90 MG tablet TAKE 1 TABLET BY MOUTH TWICE A DAY 180 tablet 0   fish oil-omega-3 fatty acids 1000 MG capsule Take 1 g by mouth 2 (two) times daily.      lisinopril (ZESTRIL) 40 MG tablet TAKE 1/2 TABLET BY MOUTH TWICE A DAY 90 tablet 3   metFORMIN (GLUCOPHAGE) 500 MG tablet TAKE 1 TABLET BY MOUTH TWICE A DAY WITH A MEAL 180 tablet 3   Multiple Vitamin (MULITIVITAMIN WITH MINERALS) TABS Take 1 tablet by mouth daily.     Potassium Citrate 15 MEQ (1620 MG) TBCR Take 1 tablet by mouth 2 (two) times daily.  11   pravastatin (PRAVACHOL) 20 MG tablet TAKE 1 TABLET BY MOUTH EVERY DAY 90 tablet 0   benzonatate (TESSALON) 200 MG capsule Take 1 capsule (200 mg total) by mouth 2 (two) times daily as needed for cough. 20 capsule 0   fluticasone (FLONASE) 50 MCG/ACT nasal spray Place 2 sprays into both nostrils daily. 16 g 6   gabapentin (NEURONTIN) 300 MG capsule Take 300 mg by mouth at bedtime as needed.     penciclovir (DENAVIR) 1 % cream Apply 1 application topically every 2 (two) hours. 5 g 2    Results for orders placed or performed during the hospital encounter of 04/13/21 (from the past 48 hour(s))  CBC with Differential/Platelet     Status: None   Collection Time: 04/13/21  2:48 PM  Result Value Ref Range   WBC 9.3 4.0 - 10.5 K/uL   RBC 4.21 3.87 - 5.11 MIL/uL   Hemoglobin 12.7 12.0 - 15.0 g/dL    HCT 37.3 36.0 - 46.0 %   MCV 88.6 80.0 - 100.0  fL   MCH 30.2 26.0 - 34.0 pg   MCHC 34.0 30.0 - 36.0 g/dL   RDW 12.0 11.5 - 15.5 %   Platelets 263 150 - 400 K/uL   nRBC 0.0 0.0 - 0.2 %   Neutrophils Relative % 79 %   Neutro Abs 7.3 1.7 - 7.7 K/uL   Lymphocytes Relative 11 %   Lymphs Abs 1.0 0.7 - 4.0 K/uL   Monocytes Relative 9 %   Monocytes Absolute 0.9 0.1 - 1.0 K/uL   Eosinophils Relative 1 %   Eosinophils Absolute 0.1 0.0 - 0.5 K/uL   Basophils Relative 0 %   Basophils Absolute 0.0 0.0 - 0.1 K/uL   Immature Granulocytes 0 %   Abs Immature Granulocytes 0.04 0.00 - 0.07 K/uL    Comment: Performed at [facility], [place] [address], [place], [place] [address]   No results found.  Review of Systems  Constitutional:  Positive for fever.  Genitourinary:  Positive for flank pain.  All other systems reviewed and are negative.  Blood pressure 121/83, pulse (!) 106, temperature (!) 100.4 F (38 C), temperature source Oral, resp. rate 20, height 4\' 11"  (1.499 m), weight 57.2 kg, SpO2 96 %. Physical Exam Vitals reviewed.  HENT:     Nose: Nose normal.  Eyes:     Pupils: Pupils are equal, round, and reactive to light.  Cardiovascular:     Rate and Rhythm: Tachycardia present.  Pulmonary:     Effort: Pulmonary effort is normal.  Abdominal:     General: Abdomen is flat.  Genitourinary:    Comments: Mild Rt CVAT at present.  Musculoskeletal:        General: Normal range of motion.     Cervical back: Normal range of motion.  Neurological:     General: No focal deficit present.     Mental Status: She is alert.  Psychiatric:        Mood and Affect: Mood normal.     Assessment/Plan  Rec uregent cysto, Rt retrograde, Rt ureteral stent placement today for renal decompression, then IV ABX, UCX, and observe until afebrilve x 24 hours before DC. Rec Rt ureteroscopy in elective setting after clears infectious parameters as safest approach as she has developed some  concerning infectious paramteres in holding room.   Risks, benefits, alternatives, expected peri-op course discussed.   Alexis Frock, MD 04/13/2021, 3:15 PM

## 2021-04-13 NOTE — Anesthesia Preprocedure Evaluation (Addendum)
Anesthesia Evaluation  Patient identified by MRN, date of birth, ID band Patient awake    Reviewed: Allergy & Precautions, H&P , NPO status , Patient's Chart, lab work & pertinent test results  History of Anesthesia Complications (+) PONV and history of anesthetic complications  Airway Mallampati: III  TM Distance: >3 FB Neck ROM: Full    Dental no notable dental hx. (+) Upper Dentures, Dental Advisory Given   Pulmonary neg pulmonary ROS, former smoker,  Quit smoking 2012, 5 pack year history    Pulmonary exam normal breath sounds clear to auscultation       Cardiovascular hypertension, Pt. on medications  Rhythm:Regular Rate:Normal  PFO  Echo 2010 (time of CVA): 1. Left ventricle: The cavity size was normal. Wall thickness was   increased in a pattern of mild LVH. Systolic function was normal.   The estimated ejection fraction was in the range of 60% to 65%.   Wall motion was normal; there were no regional wall motion   abnormalities.  2. Aortic valve: There was no stenosis.  3. Aorta: Normal caliber thoracic aorta. There was grade IV atheroma   (0.46 cm thick) in the aortic arch.  4. Mitral valve: Trivial regurgitation.  5. Left atrium: No evidence of thrombus in the atrial cavity or   appendage.  6. Right atrium: No evidence of thrombus in the atrial cavity or   appendage.  7. Atrial septum: There was a small patent foramen ovale noted by   bubble study with valsalva.     Neuro/Psych  Headaches, CVA (2010, occasional numbness/tingling LUE), No Residual Symptoms negative psych ROS   GI/Hepatic negative GI ROS, Neg liver ROS,   Endo/Other  diabetes, Well Controlled, Type 2, Oral Hypoglycemic Agentsa1c 5.6  Renal/GU Renal diseaseRight ureteral stone, solitary kidney   negative genitourinary   Musculoskeletal negative musculoskeletal ROS (+)   Abdominal   Peds   Hematology negative hematology ROS (+)   Anesthesia Other Findings   Reproductive/Obstetrics negative OB ROS                           Anesthesia Physical Anesthesia Plan  ASA: 2  Anesthesia Plan: General   Post-op Pain Management: Tylenol PO (pre-op)*   Induction: Intravenous  PONV Risk Score and Plan: 3 and Ondansetron, Dexamethasone, Midazolam and Treatment may vary due to age or medical condition  Airway Management Planned: LMA  Additional Equipment: None  Intra-op Plan:   Post-operative Plan: Extubation in OR  Informed Consent: I have reviewed the patients History and Physical, chart, labs and discussed the procedure including the risks, benefits and alternatives for the proposed anesthesia with the patient or authorized representative who has indicated his/her understanding and acceptance.     Dental advisory given  Plan Discussed with: CRNA and Surgeon  Anesthesia Plan Comments:       Anesthesia Quick Evaluation

## 2021-04-13 NOTE — Transfer of Care (Signed)
Immediate Anesthesia Transfer of Care Note  Patient: Brenda Gomez  Procedure(s) Performed: CYSTOSCOPY/URETEROSCOPY/retrograde pylegram/STENT PLACEMENT (Right: Ureter)  Patient Location: PACU  Anesthesia Type:General  Level of Consciousness: drowsy and patient cooperative  Airway & Oxygen Therapy: Patient Spontanous Breathing and Patient connected to face mask oxygen  Post-op Assessment: Report given to RN and Post -op Vital signs reviewed and stable  Post vital signs: Reviewed and stable  Last Vitals:  Vitals Value Taken Time  BP 111/67 04/13/21 1646  Temp    Pulse 104 04/13/21 1649  Resp 17 04/13/21 1649  SpO2 100 % 04/13/21 1649  Vitals shown include unvalidated device data.  Last Pain:  Vitals:   04/13/21 1434  TempSrc: Oral  PainSc: 4          Complications: No notable events documented.

## 2021-04-13 NOTE — Brief Op Note (Signed)
04/13/2021  4:36 PM  PATIENT:  Brenda Gomez  61 y.o. female  PRE-OPERATIVE DIAGNOSIS:  Right ureteral stone in solitary kidney, fever  POST-OPERATIVE DIAGNOSIS:  right ureteral stone in solitary kidney, fever  PROCEDURE:  Cystoscopy with RIGHT retrograde pyelogram and ureteral stent placement  SURGEON:  Surgeon(s) and Role:    Alexis Frock, MD - Primary  PHYSICIAN ASSISTANT:   ASSISTANTS: none   ANESTHESIA:   general  EBL:  minimal   BLOOD ADMINISTERED:none  DRAINS: none   LOCAL MEDICATIONS USED:  NONE  SPECIMEN:  Source of Specimen:  Rt renal pelvis urine  DISPOSITION OF SPECIMEN:   microbiology  COUNTS:  YES  TOURNIQUET:  * No tourniquets in log *  DICTATION: .Other Dictation: Dictation Number [id number]  PLAN OF CARE: Admit for overnight observation  PATIENT DISPOSITION:  PACU - hemodynamically stable.   Delay start of Pharmacological VTE agent (>24hrs) due to surgical blood loss or risk of bleeding: not applicable

## 2021-04-13 NOTE — Anesthesia Postprocedure Evaluation (Signed)
Anesthesia Post Note  Patient: Brenda Gomez  Procedure(s) Performed: CYSTOSCOPY/URETEROSCOPY/retrograde pylegram/STENT PLACEMENT (Right: Ureter)     Patient location during evaluation: PACU Anesthesia Type: General Level of consciousness: awake and alert Pain management: pain level controlled Vital Signs Assessment: post-procedure vital signs reviewed and stable Respiratory status: spontaneous breathing, nonlabored ventilation and respiratory function stable Cardiovascular status: blood pressure returned to baseline and stable Postop Assessment: no apparent nausea or vomiting Anesthetic complications: no   No notable events documented.  Last Vitals:  Vitals:   04/13/21 1700 04/13/21 1715  BP: 111/71 109/66  Pulse: (!) 106 100  Resp: 18 18  Temp:    SpO2: 97% 93%    Last Pain:  Vitals:   04/13/21 1715  TempSrc:   PainSc: 0-No pain                 Taleia Sadowski,W. EDMOND

## 2021-04-13 NOTE — Anesthesia Procedure Notes (Signed)
Procedure Name: LMA Insertion Date/Time: 04/13/2021 4:10 PM Performed by: Montel Clock, CRNA Pre-anesthesia Checklist: Patient identified, Emergency Drugs available, Suction available, Patient being monitored and Timeout performed Patient Re-evaluated:Patient Re-evaluated prior to induction Oxygen Delivery Method: Circle system utilized Preoxygenation: Pre-oxygenation with 100% oxygen Induction Type: IV induction LMA: LMA with gastric port inserted LMA Size: 4.0 Number of attempts: 1 Dental Injury: Teeth and Oropharynx as per pre-operative assessment

## 2021-04-14 ENCOUNTER — Encounter (HOSPITAL_COMMUNITY): Payer: Self-pay | Admitting: Urology

## 2021-04-14 DIAGNOSIS — Z7982 Long term (current) use of aspirin: Secondary | ICD-10-CM | POA: Diagnosis not present

## 2021-04-14 DIAGNOSIS — Z20822 Contact with and (suspected) exposure to covid-19: Secondary | ICD-10-CM | POA: Diagnosis not present

## 2021-04-14 DIAGNOSIS — I10 Essential (primary) hypertension: Secondary | ICD-10-CM | POA: Diagnosis not present

## 2021-04-14 DIAGNOSIS — N201 Calculus of ureter: Secondary | ICD-10-CM | POA: Diagnosis not present

## 2021-04-14 DIAGNOSIS — Z87891 Personal history of nicotine dependence: Secondary | ICD-10-CM | POA: Diagnosis not present

## 2021-04-14 DIAGNOSIS — N281 Cyst of kidney, acquired: Secondary | ICD-10-CM | POA: Diagnosis not present

## 2021-04-14 LAB — GLUCOSE, CAPILLARY
Glucose-Capillary: 111 mg/dL — ABNORMAL HIGH (ref 70–99)
Glucose-Capillary: 153 mg/dL — ABNORMAL HIGH (ref 70–99)
Glucose-Capillary: 97 mg/dL (ref 70–99)

## 2021-04-14 MED ORDER — CEPHALEXIN 500 MG PO CAPS: 500.0000 mg | ORAL_CAPSULE | Freq: Two times a day (BID) | ORAL | 0 refills | Status: AC

## 2021-04-14 MED ORDER — SENNOSIDES-DOCUSATE SODIUM 8.6-50 MG PO TABS: 1.0000 | ORAL_TABLET | Freq: Two times a day (BID) | ORAL | 0 refills | Status: DC

## 2021-04-14 MED ORDER — KETOROLAC TROMETHAMINE 10 MG PO TABS
10.0000 mg | ORAL_TABLET | Freq: Three times a day (TID) | ORAL | 0 refills | Status: DC | PRN
Start: 2021-04-14 — End: 2021-04-28

## 2021-04-14 MED ORDER — OXYCODONE-ACETAMINOPHEN 5-325 MG PO TABS: 1.0000 | ORAL_TABLET | Freq: Four times a day (QID) | ORAL | 0 refills | Status: DC | PRN

## 2021-04-14 NOTE — Op Note (Signed)
NAME: Brenda, Gomez MEDICAL RECORD NO: [id number] ACCOUNT NO: [id number] DATE OF BIRTH: 1960/02/22 FACILITY: [facility] LOCATION: [facility] PHYSICIAN: Alexis Frock, MD  Operative Report   DATE OF PROCEDURE: 04/13/2021  PREOPERATIVE DIAGNOSIS:  Right ureteral stone, functionally solitary kidney, fevers with bacteriuria.  PROCEDURES PERFORMED:   1.  Cystoscopy with right retrograde pyelogram and interpretation. 2.  Insertion of right ureteral stent.  ESTIMATED BLOOD LOSS:   Nil.  MEDICATIONS:  None.  SPECIMEN:  Right renal pelvis urine for Gram stain and culture.  FINDINGS:   1.  Very impacted right distal ureteral stone with significant hydronephrosis and tortuosity. 2.  Successful placement of right ureteral stent proximal end in upper pole, distal end in urinary bladder. 3.  Proteinaceous appearing urine.  INDICATIONS FOR PROCEDURE:  Brenda Gomez is a very pleasant 61 year old lady with longstanding history of recurrent urolithiasis, she has an essentially completely atrophic left kidney, likely due to sequela from recurrent stones.  She was found on workup of  right colicky flank pain to have a right distal ureteral stone, biaxial imaging today from our office.  This is again her functionally solitary kidney.  She was urgently posted for right ureteroscopy today for resection of her solitary kidney, renal  function labs do remain acceptable. In the preoperative holding area she was noted to have new tachycardia and new fevers to 100.4.  She did have some small bacteria on urine sample earlier today.  This overall picture was now concerning for early  obstructing pyelonephritis, it was felt the safest means of management would be to take a staged approach to her stone with just stenting alone today for a renal decompression and allow her to have clear infectious parameters before definitive management  of the stone and she wish to proceed with this.  Informed consent was obtained and placed in  medical record.  PROCEDURE: The patient being verified, and the procedure being right ureteral stent placement was confirmed.  Procedure timeout was performed.  Intravenous antibiotics were administered.  General anesthesia was induced.  The patient was placed into a low  lithotomy position.  Sterile field was created, prepped and draped in the patient's vagina, introitus, and proximal thighs using iodine.  Cystourethroscopy was performed using 21-French rigid cystoscope offset lens.  Inspection of urinary bladder  revealed no diverticula, calcifications, papillary lesions.  Ureteral orifices were single bilaterally.  The urine was somewhat proteinaceous, not grossly purulent.  The right ureteral orifice was cannulated with a sequential catheter and right  retrograde pyelogram was obtained.  Right retrograde pyelogram demonstrated a single right ureter with a filling defect in distal ureter consistent with known stone.  There was significant tortuosity and hydronephrosis above this.  Multiple angulations using a ZIPwire and a KMP catheter  were then used to very carefully navigate the ureter past the stone, which was quite impacted and very carefully navigated tortuosity to the level of the renal pelvis, with this the tortuosity was then released, which was quite favorable.  The long KMP  catheter was advanced to the level of the renal pelvis.  Additional small contrast was placed to confirm placement.  A sample of urine was taken and set aside for Gram stain and culture.  A ZIPwire was once again advanced and a new 5 x 24 Polaris type  stent was placed using fluoroscopic guidance.  Good proximal and distal planes were noted.  Bladder was emptied per cystoscope.  Procedure terminated.  The patient tolerated the procedure well, no immediate periprocedural  complications.  The patient was  taken to [facility] in stable condition.  Plan for observing the patient, continue IV antibiotics and  verifying afebrile before discharge home. She will have her second stage procedure to address her stone with ureteroscopy in the elective setting.     SUJ D: 04/13/2021 4:42:47 pm T: 04/14/2021 2:20:00 am  JOB: [id number]/ [id number]

## 2021-04-14 NOTE — TOC Initial Note (Signed)
Transition of Care [facility]) - Initial/Assessment Note    Patient Details  Name: Brenda Gomez MRN: [id number] Date of Birth: [birth year]  Transition of Care [facility]) CM/SW Contact:    Leeroy Cha, RN Phone Number: 04/14/2021, 1:57 PM  Clinical Narrative:                  Transition of Care [facility]) Screening Note   Patient Details  Name: Brenda Gomez Date of Birth: 1961-02-10   Transition of Care [facility]) CM/SW Contact:    Leeroy Cha, RN Phone Number: 04/14/2021, 1:57 PM    Transition of Care Department [facility]) has reviewed patient and no TOC needs have been identified at this time. We will continue to monitor patient advancement through interdisciplinary progression rounds. If new patient transition needs arise, please place a TOC consult.    Expected Discharge Plan: Home/Self Care Barriers to Discharge: Continued Medical Work up   Patient Goals and CMS Choice Patient states their goals for this hospitalization and ongoing recovery are:: to go home CMS Medicare.gov Compare Post Acute Care list provided to:: Patient    Expected Discharge Plan and Services Expected Discharge Plan: Home/Self Care   Discharge Planning Services: CM Consult   Living arrangements for the past 2 months: Single Family Home                                      Prior Living Arrangements/Services Living arrangements for the past 2 months: Single Family Home Lives with:: Spouse   Do you feel safe going back to the place where you live?: Yes            Criminal Activity/Legal Involvement Pertinent to Current Situation/Hospitalization: No - Comment as needed  Activities of Daily Living Home Assistive Devices/Equipment: Eyeglasses ADL Screening (condition at time of admission) Patient's cognitive ability adequate to safely complete daily activities?: Yes Is the patient deaf or have difficulty hearing?: No Does the patient have difficulty seeing, even when wearing  glasses/contacts?: No Does the patient have difficulty concentrating, remembering, or making decisions?: No Patient able to express need for assistance with ADLs?: Yes Does the patient have difficulty dressing or bathing?: No Independently performs ADLs?: Yes (appropriate for developmental age) Does the patient have difficulty walking or climbing stairs?: No Weakness of Legs: None Weakness of Arms/Hands: None  Permission Sought/Granted                  Emotional Assessment Appearance:: Appears stated age     Orientation: : Oriented to Self, Oriented to Place, Oriented to  Time, Oriented to Situation Alcohol / Substance Use: Not Applicable Psych Involvement: No (comment)  Admission diagnosis:  Ureteral stone with hydronephrosis [N13.2] Patient Active Problem List   Diagnosis Date Noted   Ureteral stone with hydronephrosis 04/13/2021   Osteoporosis 08/15/2018   Genetic testing 05/30/2016   Family history of Lynch syndrome 05/09/2016   Family history of colon cancer    Family history of kidney cancer    History of CVA (cerebrovascular accident) 12/02/2008   PARESTHESIA 08/20/2008   Prediabetes 08/20/2008   Essential hypertension 08/16/2007   NEPHROLITHIASIS, HX OF 08/16/2007   HYPERLIPIDEMIA 04/06/2007   PCP:  Binnie Rail, MD Pharmacy:   [id number] - [place], [place] [address] [place] [address] Phone: [phone] Fax: [phone]  Social Determinants of Health (SDOH) Interventions    Readmission Risk Interventions No flowsheet data found.

## 2021-04-14 NOTE — Discharge Summary (Signed)
Physician Discharge Summary  Patient ID: Brenda Gomez MRN: [id number] DOB/AGE: 59-Oct-1962 59 y.o.  Admit date: 04/13/2021 Discharge date: 04/14/2021  Admission Diagnoses: RIGHT Ureteral Stone In functionally solitary Kidney with Early Pyelonephritis  Discharge Diagnoses:   RIGHT Ureteral Stone In functionally solitary Kidney with Early Pyelonephritis  Discharged Condition: good  Hospital Course: PT underwent urgent cystoscopy with RIGHT ureteral stent placemetn on 04/13/21, the day of admission, for Rt distal ureteral stone in solitary kidney. She also had some early infectious parameters with fever to 100.4 and mild tachycardai and was placed on Rocephin pre/post-op. UCX from OR 2/21 pendign at discharge. By the afternoon of 2/22 she is afebrile >12 hours, ambulatry, pain controlled on oral meds, and felt to be adequate for discahrge. She will remain on empiric PO ABX.   Consults: None  Significant Diagnostic Studies: labs: as per above  Treatments: antibiotics: ceftriaxone and surgery: as per above  Discharge Exam: Blood pressure 118/79, pulse 97, temperature 97.8 F (36.6 C), temperature source Oral, resp. rate 18, height 4\' 11"  (1.499 m), weight 57.2 kg, SpO2 99 %.  NAd, AOx3, pleasant Non-labored breathign on room air Normal heart rate No r/g Minimal Rt CVAT  No c/c/e  Disposition: HOME     Follow-up Information     Alexis Frock, MD Follow up.   Specialty: Urology Why: Office will call to arrange day surgery for stone removal. Contact information: [place]  [address] [phone]                 Signed: Alexis Frock 04/14/2021, 3:01 PM

## 2021-04-14 NOTE — Progress Notes (Signed)
Ivs removed. Pt discharged in stable condition with all patient belongings. Discharge instructions reviewed with patient and all questions answered.

## 2021-04-14 NOTE — Discharge Instructions (Signed)
1 - You may have urinary urgency (bladder spasms) and bloody urine on / off with stent in place. This is normal. ° °2 - Call MD or go to ER for fever >102, severe pain / nausea / vomiting not relieved by medications, or acute change in medical status ° °

## 2021-04-15 LAB — URINE CULTURE: Culture: 40000 — AB

## 2021-04-16 ENCOUNTER — Other Ambulatory Visit: Payer: Self-pay | Admitting: Urology

## 2021-04-16 DIAGNOSIS — L814 Other melanin hyperpigmentation: Secondary | ICD-10-CM | POA: Diagnosis not present

## 2021-04-16 DIAGNOSIS — L578 Other skin changes due to chronic exposure to nonionizing radiation: Secondary | ICD-10-CM | POA: Diagnosis not present

## 2021-04-16 DIAGNOSIS — D485 Neoplasm of uncertain behavior of skin: Secondary | ICD-10-CM | POA: Diagnosis not present

## 2021-04-16 DIAGNOSIS — L821 Other seborrheic keratosis: Secondary | ICD-10-CM | POA: Diagnosis not present

## 2021-04-16 DIAGNOSIS — D225 Melanocytic nevi of trunk: Secondary | ICD-10-CM | POA: Diagnosis not present

## 2021-04-16 DIAGNOSIS — D2272 Melanocytic nevi of left lower limb, including hip: Secondary | ICD-10-CM | POA: Diagnosis not present

## 2021-04-23 ENCOUNTER — Encounter (HOSPITAL_BASED_OUTPATIENT_CLINIC_OR_DEPARTMENT_OTHER): Payer: Self-pay | Admitting: Urology

## 2021-04-23 ENCOUNTER — Other Ambulatory Visit: Payer: Self-pay

## 2021-04-23 NOTE — Progress Notes (Addendum)
Spoke w/ via phone for pre-op interview--- pt ?Lab needs dos----  [employer] and ekg             ?Lab results------ last labs done 04-13-2021 results in epic ?COVID test -----patient states asymptomatic no test needed ?Arrive at ------- 0630 on 04-28-2021 ?NPO after MN NO Solid Food.  Clear liquids from MN until--- 0530 ?Med rec completed ?Medications to take morning of surgery ----- cardizem, if needed take oxycodone ?Diabetic medication ----- do not take metformin morning of surgery ?Patient instructed no nail polish to be worn day of surgery ?Patient instructed to bring photo id and insurance card day of surgery ?Patient aware to have Driver (ride ) / caregiver for 24 hours after surgery -- husband, riley ?Patient Special Instructions ----- n/a ?Pre-Op special Istructions ----- due to hx small PFO ,  NO Air bubbles in IV ?Patient verbalized understanding of instructions that were given at this phone interview. ?Patient denies shortness of breath, chest pain, fever, cough at this phone interview.  ? ? ?Anesthesia review:  HTN;  DM 2;  Non-functioning left kidney (chronic atrophy);   ?hx embolic stroke 27/ 6147 (still has residual left hand occasional numbness/ tingling)  at that time had positive transcranial doppler bubble study and TEE echo for small PFO.  Pt stated told too small to repair.   ?Pt stated has not had a stroke since 2010 and currently followed by pcp,  not seen by neurologist in yrs. ?Takes daily ASA 81 mg for prevention stroke.  Pt stated is continuing asa prior to surgery per dr Tresa Moore. ?

## 2021-04-28 ENCOUNTER — Encounter (HOSPITAL_BASED_OUTPATIENT_CLINIC_OR_DEPARTMENT_OTHER): Payer: Self-pay | Admitting: Urology

## 2021-04-28 ENCOUNTER — Ambulatory Visit (HOSPITAL_BASED_OUTPATIENT_CLINIC_OR_DEPARTMENT_OTHER)
Admission: RE | Admit: 2021-04-28 | Discharge: 2021-04-28 | Disposition: A | Discharge status: Home / Self Care | Payer: BC Managed Care – PPO | Attending: Urology | Admitting: Urology

## 2021-04-28 ENCOUNTER — Encounter (HOSPITAL_BASED_OUTPATIENT_CLINIC_OR_DEPARTMENT_OTHER)
Admission: RE | Disposition: A | Discharge status: Home / Self Care | Payer: Self-pay | Source: Home / Self Care | Attending: Urology

## 2021-04-28 ENCOUNTER — Other Ambulatory Visit: Payer: Self-pay

## 2021-04-28 ENCOUNTER — Ambulatory Visit (HOSPITAL_BASED_OUTPATIENT_CLINIC_OR_DEPARTMENT_OTHER): Payer: BC Managed Care – PPO | Admitting: Anesthesiology

## 2021-04-28 DIAGNOSIS — N132 Hydronephrosis with renal and ureteral calculous obstruction: Secondary | ICD-10-CM | POA: Insufficient documentation

## 2021-04-28 DIAGNOSIS — Z87891 Personal history of nicotine dependence: Secondary | ICD-10-CM | POA: Insufficient documentation

## 2021-04-28 DIAGNOSIS — N261 Atrophy of kidney (terminal): Secondary | ICD-10-CM | POA: Insufficient documentation

## 2021-04-28 DIAGNOSIS — E119 Type 2 diabetes mellitus without complications: Secondary | ICD-10-CM | POA: Diagnosis not present

## 2021-04-28 DIAGNOSIS — Z8673 Personal history of transient ischemic attack (TIA), and cerebral infarction without residual deficits: Secondary | ICD-10-CM | POA: Diagnosis not present

## 2021-04-28 DIAGNOSIS — Z466 Encounter for fitting and adjustment of urinary device: Secondary | ICD-10-CM | POA: Diagnosis not present

## 2021-04-28 DIAGNOSIS — N201 Calculus of ureter: Secondary | ICD-10-CM | POA: Diagnosis not present

## 2021-04-28 DIAGNOSIS — I10 Essential (primary) hypertension: Secondary | ICD-10-CM | POA: Diagnosis not present

## 2021-04-28 HISTORY — DX: Type 2 diabetes mellitus without complications: E11.9

## 2021-04-28 HISTORY — DX: Age-related osteoporosis without current pathological fracture: M81.0

## 2021-04-28 HISTORY — DX: Disorder of kidney and ureter, unspecified: N28.9

## 2021-04-28 HISTORY — DX: Candidiasis, unspecified: B37.9

## 2021-04-28 HISTORY — PX: HOLMIUM LASER APPLICATION: SHX5852

## 2021-04-28 HISTORY — PX: CYSTOSCOPY WITH RETROGRADE PYELOGRAM, URETEROSCOPY AND STENT PLACEMENT: SHX5789

## 2021-04-28 LAB — GLUCOSE, CAPILLARY: Glucose-Capillary: 130 mg/dL — ABNORMAL HIGH (ref 70–99)

## 2021-04-28 LAB — POCT I-STAT, CHEM 8
BUN: 23 mg/dL — ABNORMAL HIGH (ref 6–20)
Calcium, Ion: 1.22 mmol/L (ref 1.15–1.40)
Chloride: 105 mmol/L (ref 98–111)
Creatinine, Ser: 0.8 mg/dL (ref 0.44–1.00)
Glucose, Bld: 97 mg/dL (ref 70–99)
HCT: 38 % (ref 36.0–46.0)
Hemoglobin: 12.9 g/dL (ref 12.0–15.0)
Potassium: 4.6 mmol/L (ref 3.5–5.1)
Sodium: 139 mmol/L (ref 135–145)
TCO2: 26 mmol/L (ref 22–32)

## 2021-04-28 SURGERY — CYSTOURETEROSCOPY, WITH RETROGRADE PYELOGRAM AND STENT INSERTION
Anesthesia: General | Laterality: Right

## 2021-04-28 MED ORDER — LACTATED RINGERS IV SOLN: INTRAVENOUS | Status: DC

## 2021-04-28 MED ORDER — ONDANSETRON HCL 4 MG/2ML IJ SOLN
INTRAMUSCULAR | Status: AC
  Filled 2021-04-28: qty 2

## 2021-04-28 MED ORDER — FENTANYL CITRATE (PF) 100 MCG/2ML IJ SOLN
INTRAMUSCULAR | Status: DC | PRN
Start: 2021-04-28 — End: 2021-04-28
  Administered 2021-04-28: 25 ug via INTRAVENOUS
  Administered 2021-04-28: 50 ug via INTRAVENOUS
  Administered 2021-04-28: 25 ug via INTRAVENOUS

## 2021-04-28 MED ORDER — OXYCODONE-ACETAMINOPHEN 5-325 MG PO TABS: 1.0000 | ORAL_TABLET | Freq: Four times a day (QID) | ORAL | 0 refills | Status: DC | PRN

## 2021-04-28 MED ORDER — LIDOCAINE HCL (PF) 2 % IJ SOLN
INTRAMUSCULAR | Status: AC
  Filled 2021-04-28: qty 5

## 2021-04-28 MED ORDER — DEXAMETHASONE SODIUM PHOSPHATE 10 MG/ML IJ SOLN
INTRAMUSCULAR | Status: AC
  Filled 2021-04-28: qty 1

## 2021-04-28 MED ORDER — EPHEDRINE 5 MG/ML INJ
INTRAVENOUS | Status: AC
  Filled 2021-04-28: qty 5

## 2021-04-28 MED ORDER — KETOROLAC TROMETHAMINE 30 MG/ML IJ SOLN
INTRAMUSCULAR | Status: DC | PRN
  Administered 2021-04-28: 30 mg via INTRAVENOUS

## 2021-04-28 MED ORDER — PROPOFOL 10 MG/ML IV BOLUS
INTRAVENOUS | Status: AC
  Filled 2021-04-28: qty 20

## 2021-04-28 MED ORDER — EPHEDRINE SULFATE (PRESSORS) 50 MG/ML IJ SOLN
INTRAMUSCULAR | Status: DC | PRN
  Administered 2021-04-28: 5 mg via INTRAVENOUS
  Administered 2021-04-28 (×2): 10 mg via INTRAVENOUS

## 2021-04-28 MED ORDER — MIDAZOLAM HCL 5 MG/5ML IJ SOLN
INTRAMUSCULAR | Status: DC | PRN
  Administered 2021-04-28: 2 mg via INTRAVENOUS

## 2021-04-28 MED ORDER — PHENYLEPHRINE HCL (PRESSORS) 10 MG/ML IV SOLN
INTRAVENOUS | Status: DC | PRN
  Administered 2021-04-28 (×2): 80 ug via INTRAVENOUS

## 2021-04-28 MED ORDER — LIDOCAINE HCL (CARDIAC) PF 100 MG/5ML IV SOSY
PREFILLED_SYRINGE | INTRAVENOUS | Status: DC | PRN
  Administered 2021-04-28: 60 mg via INTRAVENOUS

## 2021-04-28 MED ORDER — FENTANYL CITRATE (PF) 100 MCG/2ML IJ SOLN: 25.0000 ug | INTRAMUSCULAR | Status: DC | PRN

## 2021-04-28 MED ORDER — DEXAMETHASONE SODIUM PHOSPHATE 4 MG/ML IJ SOLN
INTRAMUSCULAR | Status: DC | PRN
  Administered 2021-04-28: 8 mg via INTRAVENOUS

## 2021-04-28 MED ORDER — PHENYLEPHRINE 40 MCG/ML (10ML) SYRINGE FOR IV PUSH (FOR BLOOD PRESSURE SUPPORT)
PREFILLED_SYRINGE | INTRAVENOUS | Status: AC
  Filled 2021-04-28: qty 10

## 2021-04-28 MED ORDER — SODIUM CHLORIDE 0.9 % IR SOLN
Status: DC | PRN
  Administered 2021-04-28: 6000 mL via INTRAVESICAL

## 2021-04-28 MED ORDER — KETOROLAC TROMETHAMINE 30 MG/ML IJ SOLN: 30.0000 mg | Freq: Once | INTRAMUSCULAR | Status: DC | PRN

## 2021-04-28 MED ORDER — KETOROLAC TROMETHAMINE 10 MG PO TABS
10.0000 mg | ORAL_TABLET | Freq: Three times a day (TID) | ORAL | 0 refills | Status: DC | PRN
Start: 2021-04-28 — End: 2021-06-21

## 2021-04-28 MED ORDER — PROPOFOL 10 MG/ML IV BOLUS
INTRAVENOUS | Status: DC | PRN
  Administered 2021-04-28: 160 mg via INTRAVENOUS

## 2021-04-28 MED ORDER — FLUCONAZOLE 100MG IVPB
100.0000 mg | Freq: Once | INTRAVENOUS | Status: AC
  Administered 2021-04-28: 100 mg via INTRAVENOUS
  Filled 2021-04-28 (×2): qty 50

## 2021-04-28 MED ORDER — FENTANYL CITRATE (PF) 100 MCG/2ML IJ SOLN
INTRAMUSCULAR | Status: AC
  Filled 2021-04-28: qty 2

## 2021-04-28 MED ORDER — GENTAMICIN SULFATE 40 MG/ML IJ SOLN
5.0000 mg/kg | INTRAVENOUS | Status: AC
  Administered 2021-04-28: 290 mg via INTRAVENOUS
  Filled 2021-04-28: qty 7.25

## 2021-04-28 MED ORDER — MIDAZOLAM HCL 2 MG/2ML IJ SOLN
INTRAMUSCULAR | Status: AC
  Filled 2021-04-28: qty 2

## 2021-04-28 MED ORDER — ONDANSETRON HCL 4 MG/2ML IJ SOLN: 4.0000 mg | Freq: Once | INTRAMUSCULAR | Status: DC | PRN

## 2021-04-28 MED ORDER — IOHEXOL 300 MG/ML  SOLN
INTRAMUSCULAR | Status: DC | PRN
  Administered 2021-04-28: 10 mL via URETHRAL

## 2021-04-28 MED ORDER — ONDANSETRON HCL 4 MG/2ML IJ SOLN
INTRAMUSCULAR | Status: DC | PRN
Start: 2021-04-28 — End: 2021-04-28
  Administered 2021-04-28: 4 mg via INTRAVENOUS

## 2021-04-28 MED ORDER — KETOROLAC TROMETHAMINE 30 MG/ML IJ SOLN
INTRAMUSCULAR | Status: AC
  Filled 2021-04-28: qty 1

## 2021-04-28 SURGICAL SUPPLY — 25 items
BAG DRAIN URO-CYSTO SKYTR STRL (DRAIN) ×2 IMPLANT
BAG DRN UROCATH (DRAIN) ×1
BASKET LASER NITINOL 1.9FR (BASKET) ×1 IMPLANT
BSKT STON RTRVL 120 1.9FR (BASKET) ×1
CATH INTERMIT  6FR 70CM (CATHETERS) ×1 IMPLANT
CLOTH BEACON ORANGE TIMEOUT ST (SAFETY) ×2 IMPLANT
FIBER LASER FLEXIVA 365 (UROLOGICAL SUPPLIES) IMPLANT
GLOVE SURG ENC MOIS LTX SZ7.5 (GLOVE) ×2 IMPLANT
GOWN STRL REUS W/TWL LRG LVL3 (GOWN DISPOSABLE) ×2 IMPLANT
GUIDEWIRE ANG ZIPWIRE 038X150 (WIRE) ×2 IMPLANT
GUIDEWIRE STR DUAL SENSOR (WIRE) ×2 IMPLANT
IV NS 1000ML (IV SOLUTION) ×2
IV NS 1000ML BAXH (IV SOLUTION) ×1 IMPLANT
IV NS IRRIG 3000ML ARTHROMATIC (IV SOLUTION) ×4 IMPLANT
KIT TURNOVER CYSTO (KITS) ×2 IMPLANT
MANIFOLD NEPTUNE II (INSTRUMENTS) ×2 IMPLANT
NS IRRIG 500ML POUR BTL (IV SOLUTION) ×2 IMPLANT
PACK CYSTO (CUSTOM PROCEDURE TRAY) ×2 IMPLANT
STENT POLARIS 5FRX24 (STENTS) ×1 IMPLANT
SYR 10ML LL (SYRINGE) ×2 IMPLANT
TRACTIP FLEXIVA PULS ID 200XHI (Laser) IMPLANT
TRACTIP FLEXIVA PULSE ID 200 (Laser) ×2
TUBE CONNECTING 12X1/4 (SUCTIONS) ×2 IMPLANT
TUBE FEEDING 8FR 16IN STR KANG (MISCELLANEOUS) ×1 IMPLANT
TUBING UROLOGY SET (TUBING) ×2 IMPLANT

## 2021-04-28 NOTE — Op Note (Signed)
NAME: Brenda Gomez, Brenda Gomez MEDICAL RECORD NO: [id number] ACCOUNT NO: [id number] DATE OF BIRTH: 1960/03/26 FACILITY: [facility] LOCATION: [facility] PHYSICIAN: Sebastian Ache, MD  Operative Report   DATE OF PROCEDURE: 04/28/2021  PREOPERATIVE DIAGNOSES:  Right ureteral stone and functionally solitary kidney.  PROCEDURES PERFORMED:  1.  Cystoscopy with right retrograde pyelogram and interpretation. 2.  Right ureteroscopy with laser lithotripsy. 3.  Exchange of right ureteral stent, 5 x 24 Polaris.  ESTIMATED BLOOD LOSS:  Nil.  COMPLICATIONS:  None.  SPECIMENS:  Right ureteral stone fragments for composition analysis.  FINDINGS:  1.  Significantly impacted right distal ureteral stone with proximal hydronephrosis. 2.  Successful replacement of right ureteral stent, proximal end in the renal pelvis, distal end in urinary bladder, with tether. 3.  Complete resolution of all accessible stone fragments larger than one-third mm in the kidney and ureter following laser lithotripsy and basket extraction.  INDICATIONS:  The patient is a pleasant 61 year old lady with history of left atrophic kidney, likely due to stone disease.  She was found on workup of colicky flank pain to have a right distal ureteral stone recently.  Fortunately, her kidney function  was okay. Given functionally solitary kidney, she underwent urgent temporizing measure with stenting.  She did have some fevers perioperatively too worrisome for early obstructing pyelonephritis and cultures final growth revealed yeast.  She has since been on  Diflucan, has been without high-grade fevers and presents today for definitive management of her stone.  Informed consent was obtained and placed in medical record.  PROCEDURE IN DETAIL:  The patient being verified, procedure being right ureteroscopic stone manipulation was confirmed.  Procedure timeout was performed.  Intravenous antibiotics were administered as well as antifungal.  The patient  was placed into a low  lithotomy position.  Sterile field was created, prepped and draped the patient's vagina, introitus, and proximal thighs using iodine.  Cystourethroscopy was performed using 21-French rigid cystoscope with offset lens.  Inspection of urinary bladder  revealed distal end of right ureteral stent in situ.  Otherwise, unremarkable bladder.  Stent was grasped, brought to the level of the urethral meatus and a 0.038 ZIPwire was advanced to the level of the upper pole. Stent was exchanged for an open-ended  catheter and right retrograde pyelogram was obtained.  Right retrograde pyelogram demonstrated single right ureter, single system kidney.  Filling defect noted in the distal ureter consistent with known stone.  The ZIPwire was once again advanced and set aside as a safety wire.  An 8-French feeding tube  placed in the urinary bladder for pressure release and semirigid ureteroscopy performed of the distal right ureter alongside a separate sensor working wire.  As anticipated, there was a right distal stone.  It was quite impacted.  It was much too large  for simple basketing.  As such, holmium laser energy applied to stone using setting of 0.2 joules and 20 Hz and approximately 30% of the stone dusted, 70% fragmented into fragments that were then amenable to simple basketing with the Escape basket, which  were then sequentially removed, set aside for composition analysis. Following this, complete resolution of all accessible stone fragments within the entire length of the right ureter.  Again, this was the patient's only intraluminal stone.  She does  have a small parenchymal stone on the right that is low risk. Given her recent funguria, it was felt that brief interval stenting with tethered stent would be most prudent.  As such, a new 5 x 24 Polaris  type stent was placed over the remaining safety  wire using fluoroscopic guidance.  Good proximal and distal planes were noted.  Tether was  left in place, tucked per vagina.  The procedure was terminated.  The patient tolerated procedure well.  No immediate perioperative complications.  The patient was  taken to postanesthesia care in stable condition with plan to discharge home.   SHW D: 04/28/2021 9:12:45 am T: 04/28/2021 10:46:00 am  JOB: [id number]/ [id number]

## 2021-04-28 NOTE — Anesthesia Postprocedure Evaluation (Signed)
Anesthesia Post Note ? ?Patient: BERNADEAN SALING ? ?Procedure(s) Performed: CYSTOSCOPY WITH RETROGRADE PYELOGRAM, URETEROSCOPY AND STENT  EXCHANGE (Right) ?HOLMIUM LASER APPLICATION (Right) ? ?  ? ?Patient location during evaluation: PACU ?Anesthesia Type: General ?Level of consciousness: awake and alert ?Pain management: pain level controlled ?Vital Signs Assessment: post-procedure vital signs reviewed and stable ?Respiratory status: spontaneous breathing, nonlabored ventilation, respiratory function stable and patient connected to nasal cannula oxygen ?Cardiovascular status: blood pressure returned to baseline and stable ?Postop Assessment: no apparent nausea or vomiting ?Anesthetic complications: no ? ? ?No notable events documented. ? ?Last Vitals:  ?Vitals:  ? 04/28/21 0946 04/28/21 1010  ?BP: 100/67 97/64  ?Pulse: 79 86  ?Resp: 18 17  ?Temp:  36.4 ?C  ?SpO2: 100% 96%  ?  ?Last Pain:  ?Vitals:  ? 04/28/21 1010  ?TempSrc:   ?PainSc: 0-No pain  ? ? ?  ?  ?  ?  ?  ?  ? ?Evelean Bigler S ? ? ? ? ?

## 2021-04-28 NOTE — Anesthesia Preprocedure Evaluation (Signed)
Anesthesia Evaluation  ?Patient identified by MRN, date of birth, ID band ?Patient awake ? ? ? ?Reviewed: ?Allergy & Precautions, NPO status , Patient's Chart, lab work & pertinent test results ? ?History of Anesthesia Complications ?(+) PONV and history of anesthetic complications ? ?Airway ?Mallampati: II ? ?TM Distance: >3 FB ?Neck ROM: Full ? ? ? Dental ?no notable dental hx. ? ?  ?Pulmonary ?neg pulmonary ROS, former smoker,  ?  ?Pulmonary exam normal ?breath sounds clear to auscultation ? ? ? ? ? ? Cardiovascular ?hypertension, Normal cardiovascular exam ?Rhythm:Regular Rate:Normal ? ? ?  ?Neuro/Psych ?negative neurological ROS ? negative psych ROS  ? GI/Hepatic ?negative GI ROS, Neg liver ROS,   ?Endo/Other  ?diabetes ? Renal/GU ?negative Renal ROS  ?negative genitourinary ?  ?Musculoskeletal ?negative musculoskeletal ROS ?(+)  ? Abdominal ?  ?Peds ?negative pediatric ROS ?(+)  Hematology ?negative hematology ROS ?(+)   ?Anesthesia Other Findings ? ? Reproductive/Obstetrics ?negative OB ROS ? ?  ? ? ? ? ? ? ? ? ? ? ? ? ? ?  ?  ? ? ? ? ? ? ? ? ?Anesthesia Physical ?Anesthesia Plan ? ?ASA: 2 ? ?Anesthesia Plan: General  ? ?Post-op Pain Management:   ? ?Induction: Intravenous ? ?PONV Risk Score and Plan: 4 or greater and Ondansetron, Dexamethasone, Droperidol, Midazolam and Treatment may vary due to age or medical condition ? ?Airway Management Planned: LMA ? ?Additional Equipment:  ? ?Intra-op Plan:  ? ?Post-operative Plan: Extubation in OR ? ?Informed Consent: I have reviewed the patients History and Physical, chart, labs and discussed the procedure including the risks, benefits and alternatives for the proposed anesthesia with the patient or authorized representative who has indicated his/her understanding and acceptance.  ? ? ? ?Dental advisory given ? ?Plan Discussed with: CRNA and Surgeon ? ?Anesthesia Plan Comments:   ? ? ? ? ? ? ?Anesthesia Quick Evaluation ? ?

## 2021-04-28 NOTE — Discharge Instructions (Addendum)
1 - You may have urinary urgency (bladder spasms) and bloody urine on / off with stent in place. This is normal. ? ?2 - Remove tethered stent on Friday morning at home by pulling on string, then blue-white plastic tubing, and discarding. Office is open Friday if issues arise.  ? ?3 - Call MD or go to ER for fever >102, severe pain / nausea / vomiting not relieved by medications, or acute change in medical status ? ? ? ?CYSTOSCOPY HOME CARE INSTRUCTIONS ? ?Activity: ?Rest for the remainder of the day.  Do not drive or operate equipment today.  You may resume normal activities in one to two days as instructed by your physician.  ? ?Meals: ?Drink plenty of liquids and eat light foods such as gelatin or soup this evening.  You may return to a normal meal plan tomorrow. ? ?Return to Work: ?You may return to work in one to two days or as instructed by your physician. ? ?Special Instructions / Symptoms: ?Call your physician if any of these symptoms occur: ? ? -persistent or heavy bleeding ? -bleeding which continues after first few urination ? -large blood clots that are difficult to pass ? -urine stream diminishes or stops completely ? -fever equal to or higher than 101 degrees Farenheit. ? -cloudy urine with a strong, foul odor ? -severe pain ? ?Females should always wipe from front to back after elimination.  You may feel some burning pain when you urinate.  This should disappear with time.  Applying moist heat to the lower abdomen or a hot tub bath may help relieve the pain. \ ? ?Follow-Up / Date of Return Visit to Your Physician:   ?Call for an appointment to arrange follow-up. ? ? ? ?No ibuprofen, Advil, Aleve, Motrin, ketorolac, meloxicam, naproxen, or other NSAIDS until after 3:00pm today if needed for pain.  ? ? ? ? ?Post Anesthesia Home Care Instructions ? ?Activity: ?Get plenty of rest for the remainder of the day. A responsible individual must stay with you for 24 hours following the procedure.  ?For the next  24 hours, DO NOT: ?-Drive a car ?-Paediatric nurse ?-Drink alcoholic beverages ?-Take any medication unless instructed by your physician ?-Make any legal decisions or sign important papers. ? ?Meals: ?Start with liquid foods such as gelatin or soup. Progress to regular foods as tolerated. Avoid greasy, spicy, heavy foods. If nausea and/or vomiting occur, drink only clear liquids until the nausea and/or vomiting subsides. Call your physician if vomiting continues. ? ?Special Instructions/Symptoms: ?Your throat may feel dry or sore from the anesthesia or the breathing tube placed in your throat during surgery. If this causes discomfort, gargle with warm salt water. The discomfort should disappear within 24 hours. ? ? ?    ?

## 2021-04-28 NOTE — H&P (Signed)
Brenda Gomez is an 61 y.o. female.   ? ?Chief Complaint: Pre-Op RIGHT Ureteroscpic Stone Manipulation ? ?HPI:  ? ?1 - Atrophic Left Kidney - completely atrophic left kidney on imaging x several including CT 2018 ? ? ??2 - Recurrent Urolithiasis -  ??2013 - Rt SWL  ??2022 - KUB Rt 76m renal, scattered left renal  ??03/2021 - CT Rt 85mUVJ stone with mod hydro ?==> Rt JJ stent 04/13/21 ? ??PMH sig for appy, HTN, CVA (no deficits, no strong blood thinners). Her PCP is StBilley GoslingD ? ? ??Today "[name] seen before RIGHT ureteroscopic stone manipulation for Rt distal stone. She was stented last month as temporizing measure as theere was concern about obstructing pyelo. CX from that time grew yeast for which she is on antifungal.  ? ? ?Past Medical History:  ?Diagnosis Date  ? Allergic rhinitis   ? Dyslipidemia   ? Family history of colon cancer   ? Family history of kidney cancer   ? Family history of Lynch syndrome   ? Hand tingling   ? residual from embolic stroke in 201610 left hand occasional mild numbness/ tingleing  ? Headache(784.0)   ? History of COVID-19 06/2019  ? per pt mild symptoms that resolved  ? History of embolic stroke 209604? previous neurologist---- dr c. wiJannifer Franklin03-04-2021 not seen in yrs per pt, followed by pcp,  no stroke since);   residual left hand occasional numb/ tingle  due to small PFO per transcranial bubble study and TEE echo,  pt told did not need procedure to close pfo too small  ? History of kidney stones   ? Dr EsJunious Silk? Hypertension   ? followed by pcp   (stress cardiolite in epic 07-03-2002 normal w/ ef 67%)  ? Non-functioning kidney   ? left, chronic atrophy  ? Osteoporosis   ? PFO (patent foramen ovale) 2010  ? small --- per dr c. wiJannifer Franklinote in epic ,  positive transcranial bubble study and TEE echo  ? PONV (postoperative nausea and vomiting)   ? Right ureteral stone   ? [place]  ? followed by [place]  (04-23-2021  per pt occasionally blood sugar at  home)  ? Yeast infection   ? per pt started on medication 04-22-2021  ? ? ?Past Surgical History:  ?Procedure Laterality Date  ? CARDIOVASCULAR STRESS TEST  07/03/2002  ? Normal stress cardiolite  ? CYSTOSCOPY W/ RETROGRADES  03/24/2011  ? Procedure: CYSTOSCOPY WITH RETROGRADE PYELOGRAM;  Surgeon: MaFredricka BonineMD;  [facility];  Laterality: Right;  RT RPG, RT URETERAL STENT, BLADDER BIOPSY AND FULGERATION ?  ? CYSTOSCOPY WITH RETROGRADE PYELOGRAM, URETEROSCOPY AND STENT PLACEMENT Right 07/11/2016  ? Procedure: CYSTOSCOPY, URETEROSCOPY AND RIGHT STENT PLACEMENT;  Surgeon: WrIrine SealMD;  Location: [facility];  Service: Urology;  Laterality: Right;  ? CYSTOSCOPY/URETEROSCOPY/HOLMIUM LASER/STENT PLACEMENT Right 04/13/2021  ? Procedure: CYSTOSCOPY/URETEROSCOPY/retrograde pylegram/STENT PLACEMENT;  Surgeon: MaAlexis FrockMD;  Location: [facility];  Service: Urology;  Laterality: Right;  ? EXTRACORPOREAL SHOCK WAVE LITHOTRIPSY    ? x2 in 1986 left//   right side 03-31-2011  '@[facility]'$   ? LAPAROSCOPIC APPENDECTOMY  08/10/2001  ? '@[facility]'$   ? NEPHROLITHOTOMY Left 1998  ? TRANSCRANIAL DOPPLER  11/20/2008  ? POSITIVE BUBBLE STUDY - SMALL INTRACARDIAC RIGHT TO LEFT SHUNT  ? TRANSESOPHAGEAL ECHOCARDIOGRAM  01/19/2009  ? SMALL PFO/ NO SIGNIFICANT VALVULAR DISEASE/ EF 60-65%  ?  WISDOM TOOTH EXTRACTION    ? ? ?Family History  ?Problem Relation Age of Onset  ? Diabetes Mother   ? Stroke Mother 41  ? Nephrolithiasis Mother   ? Cancer Sister 8  ?     sarcoma; maternal half sister  ? Diabetes Maternal Aunt   ? Kidney cancer Maternal Aunt   ?     dx in her 85s  ? Diabetes Maternal Grandmother   ? Heart attack Maternal Grandmother   ?     > 65  ? Cancer Maternal Grandfather   ?     melanoma with pulmonary mets  ? Brain cancer Other   ?     MGF's sister  ? Colon cancer Brother 1  ?     maternal half brother  ? Lung cancer Paternal Uncle   ?     smoker  ? Lymphoma Paternal Grandmother   ?      hodgkins  ? Heart attack Paternal Grandfather   ? Colon cancer Cousin   ?     maternal first cousin dx in her 23s  ? Cancer Other   ?     NOS; MGFs brother  ? Colon polyps Neg Hx   ? Esophageal cancer Neg Hx   ? Rectal cancer Neg Hx   ? Stomach cancer Neg Hx   ? ?Social History:  reports that she quit smoking about 11 years ago. Her smoking use included cigarettes. She has a 5.00 pack-year smoking history. She has never used smokeless tobacco. She reports that she does not drink alcohol and does not use drugs. ? ?Allergies:  ?Allergies  ?Allergen Reactions  ? Codeine Swelling  ?  Facial edema ?Because of a history of documented adverse serious drug reaction;Medi Alert bracelet  is recommended  ? ? ?No medications prior to admission.  ? ? ?No results found for this or any previous visit (from the past 48 hour(s)). ?No results found. ? ?Review of Systems  ?Constitutional:  Negative for chills and fever.  ?Genitourinary:  Positive for urgency.  ?All other systems reviewed and are negative. ? ?Height '4\' 11"'$  (1.499 m), weight 57.2 kg. ?Physical Exam ?HENT:  ?   Head: Normocephalic.  ?   Nose: Nose normal.  ?Eyes:  ?   Pupils: Pupils are equal, round, and reactive to light.  ?Cardiovascular:  ?   Rate and Rhythm: Normal rate.  ?Pulmonary:  ?   Effort: Pulmonary effort is normal.  ?Abdominal:  ?   General: Abdomen is flat.  ?Musculoskeletal:     ?   General: Normal range of motion.  ?   Cervical back: Normal range of motion.  ?   Comments: No CVAT at present  ?Skin: ?   General: Skin is warm.  ?Neurological:  ?   General: No focal deficit present.  ?   Mental Status: She is alert.  ?Psychiatric:     ?   Mood and Affect: Mood normal.  ?  ? ?Assessment/Plan ? ?Proceed as planned with RIGHT ureteroscopic stone manipulation. Risks, benefits, alternatives, expected peri-op course discussed previously and reiterated today. l ? ?Alexis Frock, MD ?04/28/2021, 5:41 AM ? ? ? ?

## 2021-04-28 NOTE — Brief Op Note (Signed)
04/28/2021 ? ?9:08 AM ? ?PATIENT:  Brenda Gomez  61 y.o. female ? ?PRE-OPERATIVE DIAGNOSIS:  RIGHT URETERAL STONE ? ?POST-OPERATIVE DIAGNOSIS:  RIGHT URETERAL STONE ? ?PROCEDURE:  Procedure(s) with comments: ?CYSTOSCOPY WITH RETROGRADE PYELOGRAM, URETEROSCOPY AND STENT  EXCHANGE (Right) - 75 MINS ?HOLMIUM LASER APPLICATION (Right) ? ?SURGEON:  Surgeon(s) and Role: ?   Alexis Frock, MD - Primary ? ?PHYSICIAN ASSISTANT:  ? ?ASSISTANTS: none  ? ?ANESTHESIA:   general ? ?EBL:  5 mL  ? ?BLOOD ADMINISTERED:none ? ?DRAINS: none  ? ?LOCAL MEDICATIONS USED:  NONE ? ?SPECIMEN:  Source of Specimen:  Rt ureteral stone fragments ? ?DISPOSITION OF SPECIMEN:   [facility] for compositional analysis ? ?COUNTS:  YES ? ?TOURNIQUET:  * No tourniquets in log * ? ?DICTATION: .Other Dictation: Dictation Number [id number] ? ?PLAN OF CARE: Discharge to home after PACU ? ?PATIENT DISPOSITION:  PACU - hemodynamically stable. ?  ?Delay start of Pharmacological VTE agent (>24hrs) due to surgical blood loss or risk of bleeding: not applicable ? ?

## 2021-04-28 NOTE — Anesthesia Procedure Notes (Signed)
Procedure Name: LMA Insertion ?Date/Time: 04/28/2021 8:34 AM ?Performed by: Bufford Spikes, CRNA ?Pre-anesthesia Checklist: Patient identified, Emergency Drugs available, Suction available and Patient being monitored ?Patient Re-evaluated:Patient Re-evaluated prior to induction ?Oxygen Delivery Method: Circle system utilized ?Preoxygenation: Pre-oxygenation with 100% oxygen ?Induction Type: IV induction ?Ventilation: Mask ventilation without difficulty ?LMA: LMA inserted ?LMA Size: 3.0 ?Number of attempts: 1 ?Placement Confirmation: positive ETCO2 ?Tube secured with: Tape ?Dental Injury: Teeth and Oropharynx as per pre-operative assessment  ? ? ? ? ?

## 2021-04-28 NOTE — Transfer of Care (Signed)
Immediate Anesthesia Transfer of Care Note ? ?Patient: Brenda Gomez ? ?Procedure(s) Performed: CYSTOSCOPY WITH RETROGRADE PYELOGRAM, URETEROSCOPY AND STENT  EXCHANGE (Right) ?HOLMIUM LASER APPLICATION (Right) ? ?Patient Location: PACU ? ?Anesthesia Type:General ? ?Level of Consciousness: awake, alert  and oriented ? ?Airway & Oxygen Therapy: Patient Spontanous Breathing and Patient connected to nasal cannula oxygen ? ?Post-op Assessment: Report given to RN and Post -op Vital signs reviewed and stable ? ?Post vital signs: Reviewed and stable ? ?Last Vitals:  ?Vitals Value Taken Time  ?BP 103/63 04/28/21 0920  ?Temp    ?Pulse 88 04/28/21 0921  ?Resp 14 04/28/21 0921  ?SpO2 100 % 04/28/21 0921  ?Vitals shown include unvalidated device data. ? ?Last Pain:  ?Vitals:  ? 04/28/21 0717  ?TempSrc: Oral  ?PainSc: 0-No pain  ?   ? ?Patients Stated Pain Goal: 4 (04/28/21 0717) ? ?Complications: No notable events documented. ?

## 2021-04-29 ENCOUNTER — Encounter (HOSPITAL_BASED_OUTPATIENT_CLINIC_OR_DEPARTMENT_OTHER): Payer: Self-pay | Admitting: Urology

## 2021-05-11 DIAGNOSIS — N261 Atrophy of kidney (terminal): Secondary | ICD-10-CM | POA: Diagnosis not present

## 2021-05-11 DIAGNOSIS — R82998 Other abnormal findings in urine: Secondary | ICD-10-CM | POA: Diagnosis not present

## 2021-05-11 DIAGNOSIS — N200 Calculus of kidney: Secondary | ICD-10-CM | POA: Diagnosis not present

## 2021-05-29 DIAGNOSIS — N200 Calculus of kidney: Secondary | ICD-10-CM | POA: Diagnosis not present

## 2021-06-06 ENCOUNTER — Other Ambulatory Visit: Payer: Self-pay | Admitting: Internal Medicine

## 2021-06-21 ENCOUNTER — Telehealth (INDEPENDENT_AMBULATORY_CARE_PROVIDER_SITE_OTHER): Payer: BC Managed Care – PPO | Admitting: Internal Medicine

## 2021-06-21 ENCOUNTER — Encounter: Payer: Self-pay | Admitting: Internal Medicine

## 2021-06-21 DIAGNOSIS — J019 Acute sinusitis, unspecified: Secondary | ICD-10-CM | POA: Diagnosis not present

## 2021-06-21 MED ORDER — AMOXICILLIN-POT CLAVULANATE 875-125 MG PO TABS: 1.0000 | ORAL_TABLET | Freq: Two times a day (BID) | ORAL | 0 refills | Status: DC

## 2021-06-21 NOTE — Assessment & Plan Note (Signed)
Acute ?Likely bacterial  ?Start Augmentin 875-125 mg BID x 10 day ?otc cold medications/allergy medications ?Rest, fluid ?Call if no improvement ? ?

## 2021-06-21 NOTE — Progress Notes (Signed)
Virtual Visit via Video Note ? ?I connected with Brenda Gomez on 06/21/21 at  3:40 PM EDT by a video enabled telemedicine application and verified that I am speaking with the correct person using two identifiers. ?  ?I discussed the limitations of evaluation and management by telemedicine and the availability of in person appointments. The patient expressed understanding and agreed to proceed. ? ?Present for the visit:  Myself, Dr Billey Gosling, Jerene Bears.  The patient is currently at home and I am in the office.   ? ?No referring provider.  ? ? ?History of Present Illness: ?She is here for an acute visit for cold symptoms.  ? ?Her symptoms started one week ago ? ?She is experiencing chills initially when her symptoms first started, congestion, intermittent ear pain, sinus pain and pressure, sore throat, cough that is productive at times and irritation in her chest and headaches.  She denies any known fever, shortness of breath or wheezing ? ?She has tried taking allergy medication  ? ? ?Review of Systems  ?Constitutional:  Positive for chills. Negative for fever.  ?HENT:  Positive for congestion, ear pain (intermittent), sinus pain and sore throat.   ?     No PND  ?Respiratory:  Positive for cough and sputum production. Negative for shortness of breath and wheezing.   ?Gastrointestinal:  Negative for nausea.  ?Neurological:  Positive for headaches. Negative for dizziness.   ? ? ?Social History  ? ?Socioeconomic History  ? Marital status: Married  ?  Spouse name: Not on file  ? Number of children: Not on file  ? Years of education: Not on file  ? Highest education level: Not on file  ?Occupational History  ? Not on file  ?Tobacco Use  ? Smoking status: Former  ?  Packs/day: 0.25  ?  Years: 20.00  ?  Pack years: 5.00  ?  Types: Cigarettes  ?  Quit date: 03/22/2010  ?  Years since quitting: 11.2  ? Smokeless tobacco: Never  ?Vaping Use  ? Vaping Use: Never used  ?Substance and Sexual Activity  ? Alcohol use: No   ?  Alcohol/week: 0.0 standard drinks  ? Drug use: Never  ? Sexual activity: Not on file  ?Other Topics Concern  ? Not on file  ?Social History Narrative  ? Exercise: doing [employer] bar  ? ?Social Determinants of Health  ? ?Financial Resource Strain: Not on file  ?Food Insecurity: Not on file  ?Transportation Needs: Not on file  ?Physical Activity: Not on file  ?Stress: Not on file  ?Social Connections: Not on file  ? ?  ?Observations/Objective: ?Appears well in NAD ?Sounds congested ?Breathing normally ? ?Assessment and Plan: ? ?See Problem List for Assessment and Plan of chronic medical problems. ? ? ?Follow Up Instructions: ? ?  ?I discussed the assessment and treatment plan with the patient. The patient was provided an opportunity to ask questions and all were answered. The patient agreed with the plan and demonstrated an understanding of the instructions. ?  ?The patient was advised to call back or seek an in-person evaluation if the symptoms worsen or if the condition fails to improve as anticipated. ? ? ? ?Binnie Rail, MD ? ?

## 2021-06-23 ENCOUNTER — Encounter: Payer: BC Managed Care – PPO | Admitting: Internal Medicine

## 2021-06-24 ENCOUNTER — Encounter: Payer: Self-pay | Admitting: Internal Medicine

## 2021-06-24 NOTE — Progress Notes (Signed)
? ? ?Subjective:  ? ? Patient ID: Brenda Gomez, female    DOB: April 16, 1960, 61 y.o.   MRN: [id number] ? ? ?This visit occurred during the SARS-CoV-2 public health emergency.  Safety protocols were in place, including screening questions prior to the visit, additional usage of staff PPE, and extensive cleaning of exam room while observing appropriate contact time as indicated for disinfecting solutions. ? ? ? ?HPI ?Brenda Gomez is here for  ?Chief Complaint  ?Patient presents with  ? Annual Exam  ? ? ? ?I saw her earlier this week virtually for sinus infection.  The antibiotic is causing a yeast infection. ? ?She had a stent placed in her ureter in February for an obstructing stone.  She is following with urology. ? ? ? ?Medications and allergies reviewed with patient and updated if appropriate. ? ? ?Current Outpatient Medications on File Prior to Visit  ?Medication Sig Dispense Refill  ? acetaminophen (TYLENOL) 500 MG tablet Take 1,000 mg by mouth every 6 (six) hours as needed for mild pain.    ? alendronate (FOSAMAX) 70 MG tablet TAKE 1 TABLET EVERY WEEK BY ORAL ROUTE IN THE MORNING FOR 30 DAYS. (Patient taking differently: 70 mg once a week. Sunday) 12 tablet 3  ? amoxicillin-clavulanate (AUGMENTIN) 875-125 MG tablet Take 1 tablet by mouth 2 (two) times daily. 20 tablet 0  ? aspirin 81 MG tablet Take 1 tablet (81 mg total) by mouth daily. 30 tablet   ? Calcium Carbonate (CALTRATE 600 PO) Take 1 tablet by mouth at bedtime.    ? cetirizine (ZYRTEC) 10 MG tablet Take 10 mg by mouth every evening.    ? cholecalciferol (VITAMIN D) 1000 units tablet Take 1 tablet (1,000 Units total) by mouth daily. (Patient taking differently: Take 1,000 Units by mouth at bedtime.)    ? diltiazem (CARDIZEM) 90 MG tablet TAKE 1 TABLET BY MOUTH TWICE A DAY (Patient taking differently: Take 90 mg by mouth 2 (two) times daily.) 180 tablet 0  ? estradiol (VIVELLE-DOT) 0.05 MG/24HR patch Place 1 patch onto the skin 2 (two) times a week.    ? fish  oil-omega-3 fatty acids 1000 MG capsule Take 2 g by mouth at bedtime.    ? lisinopril (ZESTRIL) 40 MG tablet TAKE 1/2 TABLET BY MOUTH TWICE A DAY (Patient taking differently: Take 20 mg by mouth 2 (two) times daily.) 90 tablet 3  ? metFORMIN (GLUCOPHAGE) 500 MG tablet TAKE 1 TABLET BY MOUTH TWICE A DAY WITH A MEAL (Patient taking differently: Take 500 mg by mouth 2 (two) times daily with a meal.) 180 tablet 3  ? Multiple Vitamin (MULITIVITAMIN WITH MINERALS) TABS Take 1 tablet by mouth daily.    ? penciclovir (DENAVIR) 1 % cream Apply 1 application topically every 2 (two) hours. (Patient taking differently: Apply 1 application. topically 2 (two) times daily as needed (cold sores).) 5 g 2  ? Potassium Citrate 15 MEQ (1620 MG) TBCR Take 1 tablet by mouth 2 (two) times daily.  11  ? pravastatin (PRAVACHOL) 20 MG tablet TAKE 1 TABLET BY MOUTH EVERY DAY (Patient taking differently: Take 20 mg by mouth at bedtime.) 90 tablet 0  ? progesterone (PROMETRIUM) 100 MG capsule Take 100 mg by mouth at bedtime.    ? ?No current facility-administered medications on file prior to visit.  ? ? ?Review of Systems  ?Constitutional:  Negative for fever.  ?Eyes:  Negative for visual disturbance.  ?Respiratory:  Positive for cough (minimally productive - improved). Negative  for shortness of breath and wheezing.   ?Cardiovascular:  Negative for chest pain, palpitations and leg swelling.  ?Gastrointestinal:  Negative for abdominal pain, blood in stool, constipation, diarrhea and nausea.  ?     No gerd  ?Genitourinary:  Negative for dysuria.  ?Musculoskeletal:  Positive for arthralgias (mild) and back pain (after sitting long time).  ?Skin:  Negative for rash.  ?Neurological:  Negative for dizziness, light-headedness and headaches.  ?Psychiatric/Behavioral:  Negative for dysphoric mood. The patient is not nervous/anxious.   ? ?   ?Objective:  ? ?Vitals:  ? 06/25/21 0810  ?BP: 104/72  ?Pulse: 88  ?Temp: 98 ?F (36.7 ?C)  ?SpO2: 97%  ? ?Filed  Weights  ? 06/25/21 0810  ?Weight: 122 lb 9.6 oz (55.6 kg)  ? ?Body mass index is 24.76 kg/m?. ? ?BP Readings from Last 3 Encounters:  ?06/25/21 104/72  ?04/28/21 97/64  ?04/14/21 118/79  ? ? ?Wt Readings from Last 3 Encounters:  ?06/25/21 122 lb 9.6 oz (55.6 kg)  ?04/28/21 124 lb 1.6 oz (56.3 kg)  ?04/13/21 126 lb (57.2 kg)  ? ? ? ?  06/25/2021  ?  8:16 AM 08/16/2019  ?  8:18 AM 08/15/2018  ?  7:45 AM 11/08/2012  ?  8:44 AM  ?Depression screen PHQ 2/9  ?Decreased Interest 0 0 0 0  ?Down, Depressed, Hopeless 0 0 0 0  ?PHQ - 2 Score 0 0 0 0  ? ? ? ?   ? View : No data to display.  ?  ?  ?  ? ? ? ? ?  ?Physical Exam ?Constitutional: She appears well-developed and well-nourished. No distress.  ?HENT:  ?Head: Normocephalic and atraumatic.  ?Right Ear: External ear normal. Normal ear canal and TM ?Left Ear: External ear normal.  Normal ear canal and TM ?Mouth/Throat: Oropharynx is clear and moist.  ?Eyes: Conjunctivae and EOM are normal.  ?Neck: Neck supple. No tracheal deviation present. No thyromegaly present.  ?No carotid bruit  ?Cardiovascular: Normal rate, regular rhythm and normal heart sounds.   ?No murmur heard.  No edema. ?Pulmonary/Chest: Effort normal and breath sounds normal. No respiratory distress. She has no wheezes. She has no rales.  ?Breast: deferred   ?Abdominal: Soft. She exhibits no distension. There is no tenderness.  ?Lymphadenopathy: She has no cervical adenopathy.  ?Skin: Skin is warm and dry. She is not diaphoretic.  ?Psychiatric: She has a normal mood and affect. Her behavior is normal.  ? ? ? ?Lab Results  ?Component Value Date  ? WBC 9.3 04/13/2021  ? HGB 12.9 04/28/2021  ? HCT 38.0 04/28/2021  ? PLT 263 04/13/2021  ? GLUCOSE 97 04/28/2021  ? CHOL 200 08/16/2019  ? TRIG 58.0 08/16/2019  ? HDL 80.40 08/16/2019  ? LDLDIRECT 125.0 08/16/2007  ? LDLCALC 108 (H) 08/16/2019  ? ALT 15 11/19/2020  ? AST 15 11/19/2020  ? NA 139 04/28/2021  ? K 4.6 04/28/2021  ? CL 105 04/28/2021  ? CREATININE 0.80  04/28/2021  ? BUN 23 (H) 04/28/2021  ? CO2 22 04/13/2021  ? TSH 1.21 08/16/2019  ? HGBA1C 5.6 08/16/2019  ? ? ? ? ?   ?Assessment & Plan:  ? ?Physical exam: ?Screening blood work  ordered ?Exercise    not formal - walks dogs a lot, gardening ?Weight   normal ?Substance abuse  none ? ? ?Reviewed recommended immunizations. ? ? ?Health Maintenance  ?Topic Date Due  ? DEXA SCAN  Never done  ? MAMMOGRAM  09/07/2018  ? PAP SMEAR-Modifier  04/26/2020  ? COVID-19 Vaccine (4 - Booster for [place] series) 07/11/2021 (Originally 03/28/2020)  ? INFLUENZA VACCINE  09/21/2021  ? TETANUS/TDAP  10/03/2024  ? COLONOSCOPY (Pts 45-67yr Insurance coverage will need to be confirmed)  06/03/2025  ? Hepatitis C Screening  Completed  ? HIV Screening  Completed  ? Zoster Vaccines- Shingrix  Completed  ? HPV VACCINES  Aged Out  ?  ? ? ? ? ? ? ?See Problem List for Assessment and Plan of chronic medical problems. ? ? ? ? ?

## 2021-06-24 NOTE — Patient Instructions (Addendum)
? ? ? ?Blood work was ordered.   ? ? ?Medications changes include :   fluconazole for yeast infection ? ? ?Your prescription(s) have been sent to your pharmacy.  ? ? ? ?Return in about 1 year (around 06/26/2022) for Physical Exam. ? ? ?Health Maintenance, Female ?Adopting a healthy lifestyle and getting preventive care are important in promoting health and wellness. Ask your health care provider about: ?The right schedule for you to have regular tests and exams. ?Things you can do on your own to prevent diseases and keep yourself healthy. ?What should I know about diet, weight, and exercise? ?Eat a healthy diet ? ?Eat a diet that includes plenty of vegetables, fruits, low-fat dairy products, and lean protein. ?Do not eat a lot of foods that are high in solid fats, added sugars, or sodium. ?Maintain a healthy weight ?Body mass index (BMI) is used to identify weight problems. It estimates body fat based on height and weight. Your health care provider can help determine your BMI and help you achieve or maintain a healthy weight. ?Get regular exercise ?Get regular exercise. This is one of the most important things you can do for your health. Most adults should: ?Exercise for at least 150 minutes each week. The exercise should increase your heart rate and make you sweat (moderate-intensity exercise). ?Do strengthening exercises at least twice a week. This is in addition to the moderate-intensity exercise. ?Spend less time sitting. Even light physical activity can be beneficial. ?Watch cholesterol and blood lipids ?Have your blood tested for lipids and cholesterol at 59 years of age, then have this test every 5 years. ?Have your cholesterol levels checked more often if: ?Your lipid or cholesterol levels are high. ?You are older than 59 years of age. ?You are at high risk for heart disease. ?What should I know about cancer screening? ?Depending on your health history and family history, you may need to have cancer screening  at various ages. This may include screening for: ?Breast cancer. ?Cervical cancer. ?Colorectal cancer. ?Skin cancer. ?Lung cancer. ?What should I know about heart disease, diabetes, and high blood pressure? ?Blood pressure and heart disease ?High blood pressure causes heart disease and increases the risk of stroke. This is more likely to develop in people who have high blood pressure readings or are overweight. ?Have your blood pressure checked: ?Every 3-5 years if you are 2-37 years of age. ?Every year if you are 8 years old or older. ?Diabetes ?Have regular diabetes screenings. This checks your fasting blood sugar level. Have the screening done: ?Once every three years after age 19 if you are at a normal weight and have a low risk for diabetes. ?More often and at a younger age if you are overweight or have a high risk for diabetes. ?What should I know about preventing infection? ?Hepatitis B ?If you have a higher risk for hepatitis B, you should be screened for this virus. Talk with your health care provider to find out if you are at risk for hepatitis B infection. ?Hepatitis C ?Testing is recommended for: ?Everyone born from [birth year] through 1963. ?Anyone with known risk factors for hepatitis C. ?Sexually transmitted infections (STIs) ?Get screened for STIs, including gonorrhea and chlamydia, if: ?You are sexually active and are younger than 59 years of age. ?You are older than 59 years of age and your health care provider tells you that you are at risk for this type of infection. ?Your sexual activity has changed since you were last screened,  and you are at increased risk for chlamydia or gonorrhea. Ask your health care provider if you are at risk. ?Ask your health care provider about whether you are at high risk for HIV. Your health care provider may recommend a prescription medicine to help prevent HIV infection. If you choose to take medicine to prevent HIV, you should first get tested for HIV. You should then  be tested every 3 months for as long as you are taking the medicine. ?Pregnancy ?If you are about to stop having your period (premenopausal) and you may become pregnant, seek counseling before you get pregnant. ?Take 400 to 800 micrograms (mcg) of folic acid every day if you become pregnant. ?Ask for birth control (contraception) if you want to prevent pregnancy. ?Osteoporosis and menopause ?Osteoporosis is a disease in which the bones lose minerals and strength with aging. This can result in bone fractures. If you are 45 years old or older, or if you are at risk for osteoporosis and fractures, ask your health care provider if you should: ?Be screened for bone loss. ?Take a calcium or vitamin D supplement to lower your risk of fractures. ?Be given hormone replacement therapy (HRT) to treat symptoms of menopause. ?Follow these instructions at home: ?Alcohol use ?Do not drink alcohol if: ?Your health care provider tells you not to drink. ?You are pregnant, may be pregnant, or are planning to become pregnant. ?If you drink alcohol: ?Limit how much you have to: ?0-1 drink a day. ?Know how much alcohol is in your drink. In the [place] one drink equals one 12 oz bottle of beer (355 mL), one 5 oz glass of wine (148 mL), or one 1? oz glass of hard liquor (44 mL). ?Lifestyle ?Do not use any products that contain nicotine or tobacco. These products include cigarettes, chewing tobacco, and vaping devices, such as e-cigarettes. If you need help quitting, ask your health care provider. ?Do not use street drugs. ?Do not share needles. ?Ask your health care provider for help if you need support or information about quitting drugs. ?General instructions ?Schedule regular health, dental, and eye exams. ?Stay current with your vaccines. ?Tell your health care provider if: ?You often feel depressed. ?You have ever been abused or do not feel safe at home. ?Summary ?Adopting a healthy lifestyle and getting preventive care are important in  promoting health and wellness. ?Follow your health care provider's instructions about healthy diet, exercising, and getting tested or screened for diseases. ?Follow your health care provider's instructions on monitoring your cholesterol and blood pressure. ?This information is not intended to replace advice given to you by your health care provider. Make sure you discuss any questions you have with your health care provider. ?Document Revised: 06/29/2020 Document Reviewed: 06/29/2020 ?Elsevier Patient Education ? [place]. ? ?

## 2021-06-25 ENCOUNTER — Ambulatory Visit (INDEPENDENT_AMBULATORY_CARE_PROVIDER_SITE_OTHER): Payer: BC Managed Care – PPO | Admitting: Internal Medicine

## 2021-06-25 VITALS — BP 104/72 | HR 88 | Temp 98.0°F | Ht 59.0 in | Wt 122.6 lb

## 2021-06-25 DIAGNOSIS — I10 Essential (primary) hypertension: Secondary | ICD-10-CM

## 2021-06-25 DIAGNOSIS — E782 Mixed hyperlipidemia: Secondary | ICD-10-CM | POA: Diagnosis not present

## 2021-06-25 DIAGNOSIS — R7303 Prediabetes: Secondary | ICD-10-CM | POA: Diagnosis not present

## 2021-06-25 DIAGNOSIS — Z0000 Encounter for general adult medical examination without abnormal findings: Secondary | ICD-10-CM

## 2021-06-25 DIAGNOSIS — B3731 Acute candidiasis of vulva and vagina: Secondary | ICD-10-CM | POA: Insufficient documentation

## 2021-06-25 DIAGNOSIS — M810 Age-related osteoporosis without current pathological fracture: Secondary | ICD-10-CM

## 2021-06-25 LAB — CBC WITH DIFFERENTIAL/PLATELET
Basophils Absolute: 0 10*3/uL (ref 0.0–0.1)
Basophils Relative: 0.6 % (ref 0.0–3.0)
Eosinophils Absolute: 0.2 10*3/uL (ref 0.0–0.7)
Eosinophils Relative: 4.1 % (ref 0.0–5.0)
HCT: 37.8 % (ref 36.0–46.0)
Hemoglobin: 12.8 g/dL (ref 12.0–15.0)
Lymphocytes Relative: 38.1 % (ref 12.0–46.0)
Lymphs Abs: 2 10*3/uL (ref 0.7–4.0)
MCHC: 33.9 g/dL (ref 30.0–36.0)
MCV: 88 fl (ref 78.0–100.0)
Monocytes Absolute: 0.4 10*3/uL (ref 0.1–1.0)
Monocytes Relative: 7.2 % (ref 3.0–12.0)
Neutro Abs: 2.6 10*3/uL (ref 1.4–7.7)
Neutrophils Relative %: 50 % (ref 43.0–77.0)
Platelets: 293 10*3/uL (ref 150.0–400.0)
RBC: 4.29 Mil/uL (ref 3.87–5.11)
RDW: 13.1 % (ref 11.5–15.5)
WBC: 5.2 10*3/uL (ref 4.0–10.5)

## 2021-06-25 LAB — LIPID PANEL
Cholesterol: 190 mg/dL (ref 0–200)
HDL: 73.3 mg/dL (ref >39.00)
LDL Cholesterol: 103 mg/dL — ABNORMAL HIGH (ref 0–99)
NonHDL: 116.56
Total CHOL/HDL Ratio: 3
Triglycerides: 66 mg/dL (ref 0.0–149.0)
VLDL: 13.2 mg/dL (ref 0.0–40.0)

## 2021-06-25 LAB — COMPREHENSIVE METABOLIC PANEL
ALT: 14 U/L (ref 0–35)
AST: 16 U/L (ref 0–37)
Albumin: 4.4 g/dL (ref 3.5–5.2)
Alkaline Phosphatase: 48 U/L (ref 39–117)
BUN: 14 mg/dL (ref 6–23)
CO2: 30 mEq/L (ref 19–32)
Calcium: 9.1 mg/dL (ref 8.4–10.5)
Chloride: 102 mEq/L (ref 96–112)
Creatinine, Ser: 0.77 mg/dL (ref 0.40–1.20)
GFR: 84.89 mL/min (ref >60.00)
Glucose, Bld: 87 mg/dL (ref 70–99)
Potassium: 4.1 mEq/L (ref 3.5–5.1)
Sodium: 138 mEq/L (ref 135–145)
Total Bilirubin: 0.4 mg/dL (ref 0.2–1.2)
Total Protein: 6.8 g/dL (ref 6.0–8.3)

## 2021-06-25 LAB — HEMOGLOBIN A1C: Hgb A1c MFr Bld: 5.4 % (ref 4.6–6.5)

## 2021-06-25 LAB — VITAMIN D 25 HYDROXY (VIT D DEFICIENCY, FRACTURES): VITD: 69.77 ng/mL (ref 30.00–100.00)

## 2021-06-25 MED ORDER — PRAVASTATIN SODIUM 20 MG PO TABS: 20.0000 mg | ORAL_TABLET | Freq: Every day | ORAL | 3 refills | Status: DC

## 2021-06-25 MED ORDER — DILTIAZEM HCL 90 MG PO TABS: 90.0000 mg | ORAL_TABLET | Freq: Two times a day (BID) | ORAL | 3 refills | Status: DC

## 2021-06-25 MED ORDER — LISINOPRIL 40 MG PO TABS: ORAL_TABLET | ORAL | 3 refills | Status: DC

## 2021-06-25 MED ORDER — METFORMIN HCL 500 MG PO TABS: 500.0000 mg | ORAL_TABLET | Freq: Two times a day (BID) | ORAL | 3 refills | Status: DC

## 2021-06-25 MED ORDER — FLUCONAZOLE 150 MG PO TABS: 150.0000 mg | ORAL_TABLET | Freq: Once | ORAL | 0 refills | Status: AC

## 2021-06-25 NOTE — Assessment & Plan Note (Signed)
Chronic Regular exercise and healthy diet encouraged Check lipid panel  Continue pravastatin 20 mg daily 

## 2021-06-25 NOTE — Assessment & Plan Note (Signed)
Chronic ?dexa up to date via gyn ?Continue Fosamax 70 mg weekly ?Continue calcium and vitamin D supplementation ?Check vitamin D level ?Stressed regular exercise ?

## 2021-06-25 NOTE — Assessment & Plan Note (Signed)
Acute ?Related to current abx ?Start fluconazole 150 mg x1, may repeat as needed ?

## 2021-06-25 NOTE — Assessment & Plan Note (Signed)
Chronic ?Check a1c ?Low sugar / carb diet ?Stressed regular exercise ?Continue metformin 500 mg twice daily ?

## 2021-06-25 NOTE — Assessment & Plan Note (Addendum)
Chronic ?BP well controlled ?Continue lisinopril 20 mg bid, cardizem 90 mg bid ?cmp ? ?Advise monitoring blood pressure at home-looks like the blood pressure has been slightly low at times and if that is consistent we may need to consider decreasing BP medication slightly to ensure good blood flow to her kidney ?Stressed keeping well-hydrated ? ?

## 2021-06-25 NOTE — Addendum Note (Signed)
Addended by: Binnie Rail on: 06/25/2021 01:44 PM ? ? Modules accepted: Orders ? ?

## 2021-06-29 DIAGNOSIS — R82998 Other abnormal findings in urine: Secondary | ICD-10-CM | POA: Diagnosis not present

## 2021-06-29 DIAGNOSIS — E7253 Hyperoxaluria: Secondary | ICD-10-CM | POA: Diagnosis not present

## 2021-06-29 DIAGNOSIS — N200 Calculus of kidney: Secondary | ICD-10-CM | POA: Diagnosis not present

## 2021-06-29 DIAGNOSIS — N261 Atrophy of kidney (terminal): Secondary | ICD-10-CM | POA: Diagnosis not present

## 2021-10-31 ENCOUNTER — Other Ambulatory Visit: Payer: Self-pay | Admitting: Internal Medicine

## 2021-10-31 DIAGNOSIS — M810 Age-related osteoporosis without current pathological fracture: Secondary | ICD-10-CM

## 2022-02-05 ENCOUNTER — Other Ambulatory Visit: Payer: Self-pay | Admitting: Internal Medicine

## 2022-06-05 ENCOUNTER — Other Ambulatory Visit: Payer: Self-pay | Admitting: Internal Medicine

## 2022-06-17 ENCOUNTER — Other Ambulatory Visit: Payer: Self-pay | Admitting: Internal Medicine

## 2022-06-17 ENCOUNTER — Telehealth: Payer: Self-pay

## 2022-06-17 MED ORDER — PRAVASTATIN SODIUM 20 MG PO TABS: 20.0000 mg | ORAL_TABLET | Freq: Every day | ORAL | 0 refills | Status: DC

## 2022-06-17 MED ORDER — DILTIAZEM HCL 90 MG PO TABS: 90.0000 mg | ORAL_TABLET | Freq: Two times a day (BID) | ORAL | 0 refills | Status: DC

## 2022-06-17 NOTE — Addendum Note (Signed)
Addended by: Deatra James on: 06/17/2022 03:43 PM   Modules accepted: Orders

## 2022-06-17 NOTE — Telephone Encounter (Signed)
  pravastatin (PRAVACHOL) 20 MG tablet    diltiazem (CARDIZEM) 90 MG tablet   Pt is asking for a med refill of the above medications. LOV: 06/25/2021.

## 2022-06-17 NOTE — Telephone Encounter (Signed)
Pt due for annual in May sent 30 day until appt.Marland KitchenRaechel Gomez

## 2022-06-20 ENCOUNTER — Other Ambulatory Visit: Payer: Self-pay | Admitting: Obstetrics and Gynecology

## 2022-06-20 DIAGNOSIS — R928 Other abnormal and inconclusive findings on diagnostic imaging of breast: Secondary | ICD-10-CM

## 2022-07-04 ENCOUNTER — Ambulatory Visit
Admission: RE | Admit: 2022-07-04 | Discharge: 2022-07-04 | Disposition: A | Discharge status: Home / Self Care | Payer: BC Managed Care – PPO | Source: Ambulatory Visit | Attending: Obstetrics and Gynecology | Admitting: Obstetrics and Gynecology

## 2022-07-04 ENCOUNTER — Ambulatory Visit
Admission: RE | Admit: 2022-07-04 | Discharge: 2022-07-04 | Disposition: A | Discharge status: Home / Self Care | Payer: 59 | Source: Ambulatory Visit | Attending: Obstetrics and Gynecology | Admitting: Obstetrics and Gynecology

## 2022-07-04 DIAGNOSIS — R928 Other abnormal and inconclusive findings on diagnostic imaging of breast: Secondary | ICD-10-CM

## 2022-07-10 ENCOUNTER — Other Ambulatory Visit: Payer: Self-pay | Admitting: Internal Medicine

## 2022-07-20 ENCOUNTER — Other Ambulatory Visit: Payer: Self-pay | Admitting: Obstetrics and Gynecology

## 2022-07-20 DIAGNOSIS — N6489 Other specified disorders of breast: Secondary | ICD-10-CM

## 2022-08-01 ENCOUNTER — Encounter: Payer: BC Managed Care – PPO | Admitting: Internal Medicine

## 2022-08-01 ENCOUNTER — Other Ambulatory Visit: Payer: Self-pay | Admitting: Internal Medicine

## 2022-08-17 NOTE — Patient Instructions (Addendum)

## 2022-08-17 NOTE — Assessment & Plan Note (Signed)
Chronic Regular exercise and healthy diet encouraged Check lipid panel, cmp Continue pravastatin 20 mg daily

## 2022-08-17 NOTE — Assessment & Plan Note (Signed)
Chronic BP well controlled Continue lisinopril 20 mg bid, cardizem 90 mg bid Cmp, cbc

## 2022-08-17 NOTE — Assessment & Plan Note (Signed)
H/o CVA Residual symptoms - Mild numbness in hand when tired Taking ASA 81 mg daily BP controlled Continue pravastatin 20 mg daily

## 2022-08-17 NOTE — Assessment & Plan Note (Signed)
Chronic ?Check a1c ?Low sugar / carb diet ?Stressed regular exercise ?Continue metformin 500 mg twice daily ?

## 2022-08-17 NOTE — Assessment & Plan Note (Addendum)
Chronic dexa up to date via gyn Was on fosamax  - stopped Continue calcium and vitamin D supplementation Check vitamin D level Stressed regular exercise

## 2022-08-17 NOTE — Progress Notes (Unsigned)
Subjective:    Patient ID: Brenda Gomez, female    DOB: 10/20/1960, 62 y.o.   MRN: [id number]      HPI Darrelyn is here for a Physical exam and her chronic medical problems.    Has had a little left calf pain at times with daily activities.   Medications and allergies reviewed with patient and updated if appropriate.  Current Outpatient Medications on File Prior to Visit  Medication Sig Dispense Refill   acetaminophen (TYLENOL) 500 MG tablet Take 1,000 mg by mouth every 6 (six) hours as needed for mild pain.     aspirin 81 MG tablet Take 1 tablet (81 mg total) by mouth daily. 30 tablet    Calcium Carbonate (CALTRATE 600 PO) Take 1 tablet by mouth at bedtime.     cetirizine (ZYRTEC) 10 MG tablet Take 10 mg by mouth every evening.     cholecalciferol (VITAMIN D) 1000 units tablet Take 1 tablet (1,000 Units total) by mouth daily. (Patient taking differently: Take 1,000 Units by mouth at bedtime.)     diltiazem (CARDIZEM) 90 MG tablet TAKE 1 TABLET BY MOUTH 2 TIMES DAILY. 180 tablet 3   estradiol (VIVELLE-DOT) 0.05 MG/24HR patch Place 1 patch onto the skin 2 (two) times a week.     fish oil-omega-3 fatty acids 1000 MG capsule Take 2 g by mouth at bedtime.     lisinopril (ZESTRIL) 40 MG tablet TAKE 1/2 TABLET BY MOUTH TWICE A DAY 90 tablet 3   metFORMIN (GLUCOPHAGE) 500 MG tablet TAKE 1 TABLET BY MOUTH 2 TIMES DAILY WITH A MEAL. 180 tablet 3   Multiple Vitamin (MULITIVITAMIN WITH MINERALS) TABS Take 1 tablet by mouth daily.     penciclovir (DENAVIR) 1 % cream Apply 1 application topically every 2 (two) hours. (Patient taking differently: Apply 1 application  topically 2 (two) times daily as needed (cold sores).) 5 g 2   Potassium Citrate 15 MEQ (1620 MG) TBCR Take 1 tablet by mouth 2 (two) times daily.  11   pravastatin (PRAVACHOL) 20 MG tablet TAKE 1 TABLET (20 MG TOTAL) BY MOUTH DAILY. ANNUAL APPT DUE IN MAY MUST SEE PROVIDER FOR FUTURE REFILLS 90 tablet 0   progesterone  (PROMETRIUM) 100 MG capsule Take 100 mg by mouth at bedtime.     No current facility-administered medications on file prior to visit.    Review of Systems  Constitutional:  Negative for fever.  Eyes:  Negative for visual disturbance.  Respiratory:  Negative for cough, shortness of breath and wheezing.   Cardiovascular:  Negative for chest pain, palpitations and leg swelling.  Gastrointestinal:  Negative for abdominal pain, blood in stool, constipation and diarrhea.       No gerd  Genitourinary:  Negative for dysuria.  Musculoskeletal:  Positive for back pain (if sits a long time). Negative for arthralgias.  Skin:  Negative for rash.  Neurological:  Negative for light-headedness and headaches.  Psychiatric/Behavioral:  Negative for dysphoric mood. The patient is not nervous/anxious.        Objective:   Vitals:   08/18/22 0805  BP: 110/62  Pulse: 76  Temp: 98 F (36.7 C)  SpO2: 96%   Filed Weights   08/18/22 0805  Weight: 124 lb (56.2 kg)   Body mass index is 25.04 kg/m.  BP Readings from Last 3 Encounters:  08/18/22 110/62  06/25/21 104/72  04/28/21 97/64    Wt Readings from Last 3 Encounters:  08/18/22 124 lb (56.2 kg)  06/25/21 122 lb 9.6 oz (55.6 kg)  04/28/21 124 lb 1.6 oz (56.3 kg)       Physical Exam Constitutional: She appears well-developed and well-nourished. No distress.  HENT:  Head: Normocephalic and atraumatic.  Right Ear: External ear normal. Normal ear canal and TM Left Ear: External ear normal.  Normal ear canal and TM Mouth/Throat: Oropharynx is clear and moist.  Eyes: Conjunctivae normal.  Neck: Neck supple. No tracheal deviation present. No thyromegaly present.  No carotid bruit  Cardiovascular: Normal rate, regular rhythm and normal heart sounds.   No murmur heard.  No edema. Pulmonary/Chest: Effort normal and breath sounds normal. No respiratory distress. She has no wheezes. She has no rales.  Breast: deferred   Abdominal: Soft. She  exhibits no distension. There is no tenderness.  Lymphadenopathy: She has no cervical adenopathy.  Skin: Skin is warm and dry. She is not diaphoretic.  Psychiatric: She has a normal mood and affect. Her behavior is normal.     Lab Results  Component Value Date   WBC 5.2 06/25/2021   HGB 12.8 06/25/2021   HCT 37.8 06/25/2021   PLT 293.0 06/25/2021   GLUCOSE 87 06/25/2021   CHOL 190 06/25/2021   TRIG 66.0 06/25/2021   HDL 73.30 06/25/2021   LDLDIRECT 125.0 08/16/2007   LDLCALC 103 (H) 06/25/2021   ALT 14 06/25/2021   AST 16 06/25/2021   NA 138 06/25/2021   K 4.1 06/25/2021   CL 102 06/25/2021   CREATININE 0.77 06/25/2021   BUN 14 06/25/2021   CO2 30 06/25/2021   TSH 1.21 08/16/2019   HGBA1C 5.4 06/25/2021         Assessment & Plan:   Physical exam: Screening blood work  ordered Exercise  regularly - not as much as she  Weight  normal Substance abuse  none   Reviewed recommended immunizations.   Health Maintenance  Topic Date Due   DEXA SCAN  Never done   MAMMOGRAM  09/07/2018   PAP SMEAR-Modifier  04/26/2020   COVID-19 Vaccine (4 - 2023-24 season) 09/02/2022 (Originally 10/22/2021)   INFLUENZA VACCINE  09/22/2022   DTaP/Tdap/Td (3 - Td or Tdap) 10/03/2024   Colonoscopy  06/03/2025   Hepatitis C Screening  Completed   HIV Screening  Completed   Zoster Vaccines- Shingrix  Completed   HPV VACCINES  Aged Out          See Problem List for Assessment and Plan of chronic medical problems.

## 2022-08-18 ENCOUNTER — Ambulatory Visit (INDEPENDENT_AMBULATORY_CARE_PROVIDER_SITE_OTHER): Payer: 59 | Admitting: Internal Medicine

## 2022-08-18 ENCOUNTER — Encounter: Payer: Self-pay | Admitting: Internal Medicine

## 2022-08-18 ENCOUNTER — Telehealth: Payer: Self-pay | Admitting: Radiology

## 2022-08-18 VITALS — BP 110/62 | HR 76 | Temp 98.0°F | Ht 59.0 in | Wt 124.0 lb

## 2022-08-18 DIAGNOSIS — I10 Essential (primary) hypertension: Secondary | ICD-10-CM | POA: Diagnosis not present

## 2022-08-18 DIAGNOSIS — M810 Age-related osteoporosis without current pathological fracture: Secondary | ICD-10-CM | POA: Diagnosis not present

## 2022-08-18 DIAGNOSIS — Z0000 Encounter for general adult medical examination without abnormal findings: Secondary | ICD-10-CM | POA: Diagnosis not present

## 2022-08-18 DIAGNOSIS — E782 Mixed hyperlipidemia: Secondary | ICD-10-CM | POA: Diagnosis not present

## 2022-08-18 DIAGNOSIS — R7303 Prediabetes: Secondary | ICD-10-CM | POA: Diagnosis not present

## 2022-08-18 DIAGNOSIS — Z8673 Personal history of transient ischemic attack (TIA), and cerebral infarction without residual deficits: Secondary | ICD-10-CM

## 2022-08-18 LAB — LIPID PANEL
Cholesterol: 205 mg/dL — ABNORMAL HIGH (ref 0–200)
HDL: 78.1 mg/dL (ref >39.00)
LDL Cholesterol: 116 mg/dL — ABNORMAL HIGH (ref 0–99)
NonHDL: 127.33
Total CHOL/HDL Ratio: 3
Triglycerides: 59 mg/dL (ref 0.0–149.0)
VLDL: 11.8 mg/dL (ref 0.0–40.0)

## 2022-08-18 LAB — CBC WITH DIFFERENTIAL/PLATELET
Basophils Absolute: 0 10*3/uL (ref 0.0–0.1)
Basophils Relative: 0.8 % (ref 0.0–3.0)
Eosinophils Absolute: 0.2 10*3/uL (ref 0.0–0.7)
Eosinophils Relative: 4.2 % (ref 0.0–5.0)
HCT: 41.8 % (ref 36.0–46.0)
Hemoglobin: 13.9 g/dL (ref 12.0–15.0)
Lymphocytes Relative: 34.9 % (ref 12.0–46.0)
Lymphs Abs: 1.7 10*3/uL (ref 0.7–4.0)
MCHC: 33.3 g/dL (ref 30.0–36.0)
MCV: 88.3 fl (ref 78.0–100.0)
Monocytes Absolute: 0.4 10*3/uL (ref 0.1–1.0)
Monocytes Relative: 8.2 % (ref 3.0–12.0)
Neutro Abs: 2.5 10*3/uL (ref 1.4–7.7)
Neutrophils Relative %: 51.9 % (ref 43.0–77.0)
Platelets: 305 10*3/uL (ref 150.0–400.0)
RBC: 4.74 Mil/uL (ref 3.87–5.11)
RDW: 12.4 % (ref 11.5–15.5)
WBC: 4.8 10*3/uL (ref 4.0–10.5)

## 2022-08-18 LAB — COMPREHENSIVE METABOLIC PANEL
ALT: 16 U/L (ref 0–35)
AST: 16 U/L (ref 0–37)
Albumin: 4.4 g/dL (ref 3.5–5.2)
Alkaline Phosphatase: 40 U/L (ref 39–117)
BUN: 22 mg/dL (ref 6–23)
CO2: 31 mEq/L (ref 19–32)
Calcium: 9.7 mg/dL (ref 8.4–10.5)
Chloride: 101 mEq/L (ref 96–112)
Creatinine, Ser: 0.91 mg/dL (ref 0.40–1.20)
GFR: 68.92 mL/min (ref >60.00)
Glucose, Bld: 96 mg/dL (ref 70–99)
Potassium: 4.4 mEq/L (ref 3.5–5.1)
Sodium: 139 mEq/L (ref 135–145)
Total Bilirubin: 0.6 mg/dL (ref 0.2–1.2)
Total Protein: 7 g/dL (ref 6.0–8.3)

## 2022-08-18 LAB — VITAMIN D 25 HYDROXY (VIT D DEFICIENCY, FRACTURES): VITD: 107.07 ng/mL (ref 30.00–100.00)

## 2022-08-18 LAB — TSH: TSH: 1.24 u[IU]/mL (ref 0.35–5.50)

## 2022-08-18 LAB — HEMOGLOBIN A1C: Hgb A1c MFr Bld: 5.6 % (ref 4.6–6.5)

## 2022-08-18 NOTE — Telephone Encounter (Signed)
CRITICAL VALUE STICKER  CRITICAL VALUE: VITAMIN D - 107.07  RECEIVER (on-site recipient of call): Brittyn Salaz K  DATE & TIME NOTIFIED: 08/18/22 11:12AM   MESSENGER (representative from lab): [facility]   MD NOTIFIED: YES

## 2022-10-06 ENCOUNTER — Other Ambulatory Visit: Payer: Self-pay | Admitting: Internal Medicine

## 2022-10-28 ENCOUNTER — Other Ambulatory Visit: Payer: Self-pay | Admitting: Internal Medicine

## 2022-10-28 DIAGNOSIS — Z79890 Hormone replacement therapy (postmenopausal): Secondary | ICD-10-CM

## 2023-01-09 ENCOUNTER — Other Ambulatory Visit: Payer: Self-pay | Admitting: Obstetrics and Gynecology

## 2023-01-09 ENCOUNTER — Ambulatory Visit
Admission: RE | Admit: 2023-01-09 | Discharge: 2023-01-09 | Disposition: A | Discharge status: Home / Self Care | Payer: 59 | Source: Ambulatory Visit | Attending: Obstetrics and Gynecology | Admitting: Obstetrics and Gynecology

## 2023-01-09 ENCOUNTER — Ambulatory Visit: Payer: 59

## 2023-01-09 DIAGNOSIS — N6489 Other specified disorders of breast: Secondary | ICD-10-CM

## 2023-01-13 ENCOUNTER — Ambulatory Visit
Admission: RE | Admit: 2023-01-13 | Discharge: 2023-01-13 | Disposition: A | Discharge status: Home / Self Care | Payer: 59 | Source: Ambulatory Visit | Attending: Obstetrics and Gynecology | Admitting: Obstetrics and Gynecology

## 2023-01-13 DIAGNOSIS — N6489 Other specified disorders of breast: Secondary | ICD-10-CM

## 2023-01-13 HISTORY — PX: BREAST BIOPSY: SHX20

## 2023-01-16 LAB — SURGICAL PATHOLOGY

## 2023-01-23 ENCOUNTER — Other Ambulatory Visit: Payer: Self-pay | Admitting: Internal Medicine

## 2023-03-27 DIAGNOSIS — N200 Calculus of kidney: Secondary | ICD-10-CM | POA: Diagnosis not present

## 2023-03-27 DIAGNOSIS — N261 Atrophy of kidney (terminal): Secondary | ICD-10-CM | POA: Diagnosis not present

## 2023-04-15 DIAGNOSIS — J069 Acute upper respiratory infection, unspecified: Secondary | ICD-10-CM | POA: Diagnosis not present

## 2023-04-22 ENCOUNTER — Other Ambulatory Visit: Payer: Self-pay | Admitting: Internal Medicine

## 2023-04-22 DIAGNOSIS — Z79890 Hormone replacement therapy (postmenopausal): Secondary | ICD-10-CM

## 2023-06-26 DIAGNOSIS — Z1151 Encounter for screening for human papillomavirus (HPV): Secondary | ICD-10-CM | POA: Diagnosis not present

## 2023-06-26 DIAGNOSIS — Z124 Encounter for screening for malignant neoplasm of cervix: Secondary | ICD-10-CM | POA: Diagnosis not present

## 2023-06-26 DIAGNOSIS — Z6826 Body mass index (BMI) 26.0-26.9, adult: Secondary | ICD-10-CM | POA: Diagnosis not present

## 2023-06-26 DIAGNOSIS — Z1231 Encounter for screening mammogram for malignant neoplasm of breast: Secondary | ICD-10-CM | POA: Diagnosis not present

## 2023-06-26 DIAGNOSIS — Z01419 Encounter for gynecological examination (general) (routine) without abnormal findings: Secondary | ICD-10-CM | POA: Diagnosis not present

## 2023-07-22 ENCOUNTER — Other Ambulatory Visit: Payer: Self-pay | Admitting: Internal Medicine

## 2023-07-25 DIAGNOSIS — N939 Abnormal uterine and vaginal bleeding, unspecified: Secondary | ICD-10-CM | POA: Diagnosis not present

## 2023-09-22 DIAGNOSIS — L578 Other skin changes due to chronic exposure to nonionizing radiation: Secondary | ICD-10-CM | POA: Diagnosis not present

## 2023-09-22 DIAGNOSIS — D225 Melanocytic nevi of trunk: Secondary | ICD-10-CM | POA: Diagnosis not present

## 2023-09-22 DIAGNOSIS — L814 Other melanin hyperpigmentation: Secondary | ICD-10-CM | POA: Diagnosis not present

## 2023-09-22 DIAGNOSIS — L821 Other seborrheic keratosis: Secondary | ICD-10-CM | POA: Diagnosis not present

## 2023-09-22 DIAGNOSIS — L603 Nail dystrophy: Secondary | ICD-10-CM | POA: Diagnosis not present

## 2023-10-16 ENCOUNTER — Other Ambulatory Visit: Payer: Self-pay | Admitting: Internal Medicine

## 2023-10-16 DIAGNOSIS — Z79890 Hormone replacement therapy (postmenopausal): Secondary | ICD-10-CM

## 2023-11-10 ENCOUNTER — Other Ambulatory Visit: Payer: Self-pay | Admitting: Internal Medicine

## 2023-11-10 DIAGNOSIS — Z79890 Hormone replacement therapy (postmenopausal): Secondary | ICD-10-CM

## 2023-11-14 ENCOUNTER — Other Ambulatory Visit: Payer: Self-pay | Admitting: Internal Medicine

## 2023-11-14 DIAGNOSIS — Z79890 Hormone replacement therapy (postmenopausal): Secondary | ICD-10-CM

## 2024-01-13 ENCOUNTER — Other Ambulatory Visit: Payer: Self-pay | Admitting: Internal Medicine

## 2024-01-20 ENCOUNTER — Ambulatory Visit (HOSPITAL_COMMUNITY)

## 2024-01-20 DIAGNOSIS — S61202A Unspecified open wound of right middle finger without damage to nail, initial encounter: Secondary | ICD-10-CM | POA: Diagnosis not present

## 2024-02-01 ENCOUNTER — Ambulatory Visit: Payer: Self-pay | Admitting: Nurse Practitioner

## 2024-02-01 ENCOUNTER — Other Ambulatory Visit: Payer: Self-pay | Admitting: Nurse Practitioner

## 2024-02-01 ENCOUNTER — Ambulatory Visit: Admitting: Nurse Practitioner

## 2024-02-01 ENCOUNTER — Other Ambulatory Visit: Payer: Self-pay | Admitting: Internal Medicine

## 2024-02-01 ENCOUNTER — Encounter: Payer: Self-pay | Admitting: Nurse Practitioner

## 2024-02-01 VITALS — BP 100/66 | HR 83 | Temp 98.4°F | Ht 59.0 in | Wt 131.0 lb

## 2024-02-01 DIAGNOSIS — R059 Cough, unspecified: Secondary | ICD-10-CM

## 2024-02-01 LAB — BASIC METABOLIC PANEL WITH GFR
BUN: 19 mg/dL (ref 6–23)
CO2: 30 meq/L (ref 19–32)
Calcium: 9.8 mg/dL (ref 8.4–10.5)
Chloride: 102 meq/L (ref 96–112)
Creatinine, Ser: 0.85 mg/dL (ref 0.40–1.20)
GFR: 74.03 mL/min (ref >60.00)
Glucose, Bld: 92 mg/dL (ref 70–99)
Potassium: 4.5 meq/L (ref 3.5–5.1)
Sodium: 138 meq/L (ref 135–145)

## 2024-02-01 LAB — POCT INFLUENZA A/B
Influenza A, POC: NEGATIVE
Influenza B, POC: NEGATIVE

## 2024-02-01 LAB — POC COVID19 BINAXNOW: SARS Coronavirus 2 Ag: NEGATIVE

## 2024-02-01 MED ORDER — OSELTAMIVIR PHOSPHATE 30 MG PO CAPS: 30.0000 mg | ORAL_CAPSULE | Freq: Two times a day (BID) | ORAL | 0 refills | Status: DC

## 2024-02-01 MED ORDER — PROMETHAZINE-DM 6.25-15 MG/5ML PO SYRP: 5.0000 mL | ORAL_SOLUTION | Freq: Three times a day (TID) | ORAL | 0 refills | Status: DC | PRN

## 2024-02-01 NOTE — Progress Notes (Signed)
 Estimated Creatinine Clearance: 54.5 mL/min (by C-G formula based on SCr of 0.85 mg/dL).

## 2024-02-01 NOTE — Progress Notes (Signed)
 Established Patient Office Visit  Subjective   Patient ID: Brenda Gomez, female    DOB: 1960-07-18  Age: 63 y.o. MRN: [id number]  Chief Complaint  Patient presents with   Nasal Congestion    Nasal congestion and cough which started yesterday, some chills but not sure. Pt has only taken     Discussed the use of AI scribe software for clinical note transcription with the patient, who gave verbal consent to proceed.  History of Present Illness Brenda Gomez is a 63 year old female who presents with respiratory symptoms.  Upper respiratory symptoms - Onset yesterday morning with sneezing and dry cough, worsening throughout the day and more severe last night - Current symptoms include head congestion and mildly hoarse, throaty voice - No fever, muscle aches, neck pain, stomach pain, nausea, vomiting, diarrhea, or chest pain  Constitutional symptoms - Chills at work despite normal temperatures at home - Feels more cold than usual in her typically cold office - Perceived tachycardia  Exposure history - Recent exposure at work to coworker diagnosed with influenza A - Coworker returned to work yesterday and has been coughing frequently - Coworker's daughter had a fever last Thursday       Review of Systems  Constitutional:  Positive for chills. Negative for fever.  HENT:  Positive for congestion.   Respiratory:  Positive for cough.   Cardiovascular:  Negative for chest pain and palpitations.  Gastrointestinal:  Negative for abdominal pain, diarrhea, nausea and vomiting.  Musculoskeletal:  Negative for myalgias and neck pain.  Neurological:  Positive for headaches.      Objective:     BP 100/66   Pulse 83   Temp 98.4 F (36.9 C) (Temporal)   Ht 4' 11 (1.499 m)   Wt 131 lb (59.4 kg)   SpO2 95%   BMI 26.46 kg/m    Physical Exam Vitals reviewed.  Constitutional:      General: She is not in acute distress.    Appearance: Normal appearance.  HENT:      Head: Normocephalic and atraumatic.  Cardiovascular:     Rate and Rhythm: Normal rate and regular rhythm.     Pulses: Normal pulses.     Heart sounds: Normal heart sounds.  Pulmonary:     Effort: Pulmonary effort is normal.     Breath sounds: Normal breath sounds.  Skin:    General: Skin is warm and dry.  Neurological:     General: No focal deficit present.     Mental Status: She is alert and oriented to person, place, and time.  Psychiatric:        Mood and Affect: Mood normal.        Behavior: Behavior normal.        Judgment: Judgment normal.      Results for orders placed or performed in visit on 02/01/24  POC COVID-19 BinaxNow  Result Value Ref Range   SARS Coronavirus 2 Ag Negative Negative  POCT Influenza A/B  Result Value Ref Range   Influenza A, POC Negative Negative   Influenza B, POC Negative Negative      The 10-year ASCVD risk score (Arnett DK, et al., 2019) is: 2.5%    Assessment & Plan:   Problem List Items Addressed This Visit   None Visit Diagnoses       Cough, unspecified type    -  Primary   Relevant Orders   POC COVID-19 BinaxNow (Completed)   POCT Influenza  A/B (Completed)      Assessment and Plan Assessment & Plan Acute upper respiratory infection Likely viral etiology. Negative flu and COVID tests, potential false negatives due to early testing. Lungs clear, no chest x-ray needed. - Prescribed promethazine  dextromethorphan syrup for cough. - Advised rest, hydration, acetaminophen  or ibuprofen  for headache or fever. - Instructed to monitor symptoms, seek evaluation if symptoms persist beyond ten days or worsen.  Exposure to viral communicable disease Negative flu and COVID tests, potential false negatives. Discussed contagion risk and isolation importance. - Ordered kidney function test for Tamiflu dosing. - Prescribed Tamiflu for flu exposure, to be taken within 48 hours if symptoms worsen. - Advised isolation, mask use, hand hygiene  to prevent spread. - Discussed potential false negative COVID test, advised symptom monitoring.    Return if symptoms worsen or fail to improve.    Lauraine FORBES Pereyra, NP

## 2024-03-03 ENCOUNTER — Encounter: Payer: Self-pay | Admitting: Internal Medicine

## 2024-03-03 NOTE — Patient Instructions (Addendum)
 "     Blood work was ordered.       Medications changes include :   None    A referral was ordered and someone will call you to schedule an appointment.     Return in about 1 year (around 03/06/2025) for Physical Exam.   Health Maintenance, Female Adopting a healthy lifestyle and getting preventive care are important in promoting health and wellness. Ask your health care provider about: The right schedule for you to have regular tests and exams. Things you can do on your own to prevent diseases and keep yourself healthy. What should I know about diet, weight, and exercise? Eat a healthy diet  Eat a diet that includes plenty of vegetables, fruits, low-fat dairy products, and lean protein. Do not eat a lot of foods that are high in solid fats, added sugars, or sodium. Maintain a healthy weight Body mass index (BMI) is used to identify weight problems. It estimates body fat based on height and weight. Your health care provider can help determine your BMI and help you achieve or maintain a healthy weight. Get regular exercise Get regular exercise. This is one of the most important things you can do for your health. Most adults should: Exercise for at least 150 minutes each week. The exercise should increase your heart rate and make you sweat (moderate-intensity exercise). Do strengthening exercises at least twice a week. This is in addition to the moderate-intensity exercise. Spend less time sitting. Even light physical activity can be beneficial. Watch cholesterol and blood lipids Have your blood tested for lipids and cholesterol at 62 years of age, then have this test every 5 years. Have your cholesterol levels checked more often if: Your lipid or cholesterol levels are high. You are older than 62 years of age. You are at high risk for heart disease. What should I know about cancer screening? Depending on your health history and family history, you may need to have cancer  screening at various ages. This may include screening for: Breast cancer. Cervical cancer. Colorectal cancer. Skin cancer. Lung cancer. What should I know about heart disease, diabetes, and high blood pressure? Blood pressure and heart disease High blood pressure causes heart disease and increases the risk of stroke. This is more likely to develop in people who have high blood pressure readings or are overweight. Have your blood pressure checked: Every 3-5 years if you are 77-69 years of age. Every year if you are 65 years old or older. Diabetes Have regular diabetes screenings. This checks your fasting blood sugar level. Have the screening done: Once every three years after age 66 if you are at a normal weight and have a low risk for diabetes. More often and at a younger age if you are overweight or have a high risk for diabetes. What should I know about preventing infection? Hepatitis B If you have a higher risk for hepatitis B, you should be screened for this virus. Talk with your health care provider to find out if you are at risk for hepatitis B infection. Hepatitis C Testing is recommended for: Everyone born from [birth year] through 1965. Anyone with known risk factors for hepatitis C. Sexually transmitted infections (STIs) Get screened for STIs, including gonorrhea and chlamydia, if: You are sexually active and are younger than 62 years of age. You are older than 62 years of age and your health care provider tells you that you are at risk for this type of infection. Your sexual activity  has changed since you were last screened, and you are at increased risk for chlamydia or gonorrhea. Ask your health care provider if you are at risk. Ask your health care provider about whether you are at high risk for HIV. Your health care provider may recommend a prescription medicine to help prevent HIV infection. If you choose to take medicine to prevent HIV, you should first get tested for HIV. You  should then be tested every 3 months for as long as you are taking the medicine. Pregnancy If you are about to stop having your period (premenopausal) and you may become pregnant, seek counseling before you get pregnant. Take 400 to 800 micrograms (mcg) of folic acid every day if you become pregnant. Ask for birth control (contraception) if you want to prevent pregnancy. Osteoporosis and menopause Osteoporosis is a disease in which the bones lose minerals and strength with aging. This can result in bone fractures. If you are 41 years old or older, or if you are at risk for osteoporosis and fractures, ask your health care provider if you should: Be screened for bone loss. Take a calcium or vitamin D  supplement to lower your risk of fractures. Be given hormone replacement therapy (HRT) to treat symptoms of menopause. Follow these instructions at home: Alcohol use Do not drink alcohol if: Your health care provider tells you not to drink. You are pregnant, may be pregnant, or are planning to become pregnant. If you drink alcohol: Limit how much you have to: 0-1 drink a day. Know how much alcohol is in your drink. In the [place] one drink equals one 12 oz bottle of beer (355 mL), one 5 oz glass of wine (148 mL), or one 1 oz glass of hard liquor (44 mL). Lifestyle Do not use any products that contain nicotine or tobacco. These products include cigarettes, chewing tobacco, and vaping devices, such as e-cigarettes. If you need help quitting, ask your health care provider. Do not use street drugs. Do not share needles. Ask your health care provider for help if you need support or information about quitting drugs. General instructions Schedule regular health, dental, and eye exams. Stay current with your vaccines. Tell your health care provider if: You often feel depressed. You have ever been abused or do not feel safe at home. Summary Adopting a healthy lifestyle and getting preventive care are  important in promoting health and wellness. Follow your health care provider's instructions about healthy diet, exercising, and getting tested or screened for diseases. Follow your health care provider's instructions on monitoring your cholesterol and blood pressure. This information is not intended to replace advice given to you by your health care provider. Make sure you discuss any questions you have with your health care provider. Document Revised: 06/29/2020 Document Reviewed: 06/29/2020 Elsevier Patient Education  2024 [employer]. "

## 2024-03-03 NOTE — Progress Notes (Unsigned)
 "   Subjective:    Patient ID: Brenda Gomez, female    DOB: May 14, 1960, 64 y.o.   MRN: [id number]      HPI Brenda Gomez is here for a Physical exam and her chronic medical problems.   She is frustrated because she gained weight over the holidays and has not been able to lose it.  Over the past year or so she has been gaining weight.  Typically she has been able to lose it if she gains it, but has not been able to do so.  She feels like she needs some help to get some of this weight off.   Medications and allergies reviewed with patient and updated if appropriate.  Medications Ordered Prior to Encounter[1]  Review of Systems  Constitutional:  Negative for fever.  Eyes:  Negative for visual disturbance.  Respiratory:  Negative for cough, shortness of breath and wheezing.   Cardiovascular:  Negative for chest pain, palpitations and leg swelling.  Gastrointestinal:  Negative for abdominal pain, blood in stool, constipation and diarrhea.       No gerd  Genitourinary:  Negative for dysuria.  Musculoskeletal:  Positive for arthralgias (mild - knee). Negative for back pain.  Skin:  Negative for rash.  Neurological:  Positive for headaches (occ). Negative for light-headedness.  Psychiatric/Behavioral:  Negative for dysphoric mood and sleep disturbance. The patient is not nervous/anxious.        Objective:   Vitals:   03/06/24 0807  BP: 104/70  Pulse: 69  Temp: 98.2 F (36.8 C)  SpO2: 94%   Filed Weights   03/06/24 0807  Weight: 131 lb (59.4 kg)   Body mass index is 26.46 kg/m.  BP Readings from Last 3 Encounters:  03/06/24 104/70  02/01/24 100/66  08/18/22 110/62    Wt Readings from Last 3 Encounters:  03/06/24 131 lb (59.4 kg)  02/01/24 131 lb (59.4 kg)  08/18/22 124 lb (56.2 kg)       Physical Exam Constitutional: She appears well-developed and well-nourished. No distress.  HENT:  Head: Normocephalic and atraumatic.  Right Ear: External ear normal. Normal  ear canal and TM Left Ear: External ear normal.  Normal ear canal and TM Mouth/Throat: Oropharynx is clear and moist.  Eyes: Conjunctivae normal.  Neck: Neck supple. No tracheal deviation present. No thyromegaly present.  No carotid bruit  Cardiovascular: Normal rate, regular rhythm and normal heart sounds.   No murmur heard.  No edema. Pulmonary/Chest: Effort normal and breath sounds normal. No respiratory distress. She has no wheezes. She has no rales.  Breast: deferred   Abdominal: Soft. She exhibits no distension. There is no tenderness.  Lymphadenopathy: She has no cervical adenopathy.  Skin: Skin is warm and dry. She is not diaphoretic.  Psychiatric: She has a normal mood and affect. Her behavior is normal.     Lab Results  Component Value Date   WBC 4.9 03/06/2024   HGB 14.2 03/06/2024   HCT 41.5 03/06/2024   PLT 277.0 03/06/2024   GLUCOSE 105 (H) 03/06/2024   CHOL 196 03/06/2024   TRIG 57.0 03/06/2024   HDL 84.40 03/06/2024   LDLDIRECT 125.0 08/16/2007   LDLCALC 100 (H) 03/06/2024   ALT 21 03/06/2024   AST 18 03/06/2024   NA 139 03/06/2024   K 4.5 03/06/2024   CL 103 03/06/2024   CREATININE 0.83 03/06/2024   BUN 24 (H) 03/06/2024   CO2 30 03/06/2024   TSH 1.43 03/06/2024   HGBA1C 5.6 03/06/2024  EKG: NSR at 71 bpm, low voltage QRS, otherwise normal EKG.  Compared to previous EKG from 2014 low voltage was not present.  No significant change and no further evaluation necessary.     Assessment & Plan:   Physical exam: Screening blood work  ordered Exercise is not regular  Weight-overweight-BMI 26.46 Substance abuse  none   Reviewed recommended immunizations.   Health Maintenance  Topic Date Due   Bone Density Scan  Never done   Pneumococcal Vaccine: 50+ Years (1 of 1 - PCV) Never done   Mammogram  09/07/2018   Cervical Cancer Screening (HPV/Pap Cotest)  04/26/2020   COVID-19 Vaccine (4 - 2025-26 season) 03/19/2024 (Originally 10/23/2023)   Influenza  Vaccine  05/21/2024 (Originally 09/22/2023)   Colonoscopy  06/03/2025   DTaP/Tdap/Td (4 - Td or Tdap) 01/15/2034   Hepatitis C Screening  Completed   HIV Screening  Completed   Zoster Vaccines- Shingrix  Completed   Hepatitis B Vaccines 19-59 Average Risk  Aged Out   HPV VACCINES  Aged Out   Meningococcal B Vaccine  Aged Out      Mammogram, DEXA and Pap up-to-date-will get records    See Problem List for Assessment and Plan of chronic medical problems.        [1]  Current Outpatient Medications on File Prior to Visit  Medication Sig Dispense Refill   acetaminophen  (TYLENOL ) 500 MG tablet Take 1,000 mg by mouth every 6 (six) hours as needed for mild pain.     aspirin  81 MG tablet Take 1 tablet (81 mg total) by mouth daily. 30 tablet    Calcium Carbonate (CALTRATE 600 PO) Take 1 tablet by mouth at bedtime.     cetirizine (ZYRTEC) 10 MG tablet Take 10 mg by mouth every evening.     cholecalciferol (VITAMIN D ) 1000 units tablet Take 1 tablet (1,000 Units total) by mouth daily. (Patient taking differently: Take 1,000 Units by mouth at bedtime.)     diltiazem  (CARDIZEM ) 90 MG tablet TAKE 1 TABLET BY MOUTH TWICE A DAY 180 tablet 3   estradiol (ESTRACE) 1 MG tablet Take 1 mg by mouth daily.     fish oil-omega-3 fatty acids 1000 MG capsule Take 2 g by mouth at bedtime.     lisinopril  (ZESTRIL ) 40 MG tablet TAKE 1/2 TABLET TWICE A DAY BY MOUTH 90 tablet 3   metFORMIN  (GLUCOPHAGE ) 500 MG tablet TAKE 1 TABLET BY MOUTH TWICE A DAY WITH FOOD 180 tablet 3   Multiple Vitamin (MULITIVITAMIN WITH MINERALS) TABS Take 1 tablet by mouth daily.     Potassium Citrate 15 MEQ (1620 MG) TBCR Take 1 tablet by mouth 2 (two) times daily.  11   pravastatin  (PRAVACHOL ) 20 MG tablet TAKE 1 TABLET BY MOUTH EVERY DAY 90 tablet 2   progesterone (PROMETRIUM) 100 MG capsule TAKE 1 CAPSULE BY MOUTH EVERY DAY 30 capsule 0   No current facility-administered medications on file prior to visit.   "

## 2024-03-06 ENCOUNTER — Ambulatory Visit: Admitting: Internal Medicine

## 2024-03-06 VITALS — BP 104/70 | HR 69 | Temp 98.2°F | Ht 59.0 in | Wt 131.0 lb

## 2024-03-06 DIAGNOSIS — I10 Essential (primary) hypertension: Secondary | ICD-10-CM | POA: Diagnosis not present

## 2024-03-06 DIAGNOSIS — Z8673 Personal history of transient ischemic attack (TIA), and cerebral infarction without residual deficits: Secondary | ICD-10-CM

## 2024-03-06 DIAGNOSIS — Z136 Encounter for screening for cardiovascular disorders: Secondary | ICD-10-CM

## 2024-03-06 DIAGNOSIS — M810 Age-related osteoporosis without current pathological fracture: Secondary | ICD-10-CM

## 2024-03-06 DIAGNOSIS — Z0000 Encounter for general adult medical examination without abnormal findings: Secondary | ICD-10-CM

## 2024-03-06 DIAGNOSIS — E7800 Pure hypercholesterolemia, unspecified: Secondary | ICD-10-CM | POA: Diagnosis not present

## 2024-03-06 DIAGNOSIS — E663 Overweight: Secondary | ICD-10-CM | POA: Diagnosis not present

## 2024-03-06 DIAGNOSIS — R7303 Prediabetes: Secondary | ICD-10-CM | POA: Diagnosis not present

## 2024-03-06 LAB — COMPREHENSIVE METABOLIC PANEL WITH GFR
ALT: 21 U/L (ref 3–35)
AST: 18 U/L (ref 5–37)
Albumin: 4.6 g/dL (ref 3.5–5.2)
Alkaline Phosphatase: 46 U/L (ref 39–117)
BUN: 24 mg/dL — ABNORMAL HIGH (ref 6–23)
CO2: 30 meq/L (ref 19–32)
Calcium: 9.4 mg/dL (ref 8.4–10.5)
Chloride: 103 meq/L (ref 96–112)
Creatinine, Ser: 0.83 mg/dL (ref 0.40–1.20)
GFR: 76.13 mL/min
Glucose, Bld: 105 mg/dL — ABNORMAL HIGH (ref 70–99)
Potassium: 4.5 meq/L (ref 3.5–5.1)
Sodium: 139 meq/L (ref 135–145)
Total Bilirubin: 0.4 mg/dL (ref 0.2–1.2)
Total Protein: 7.4 g/dL (ref 6.0–8.3)

## 2024-03-06 LAB — CBC
HCT: 41.5 % (ref 36.0–46.0)
Hemoglobin: 14.2 g/dL (ref 12.0–15.0)
MCHC: 34.3 g/dL (ref 30.0–36.0)
MCV: 87.7 fl (ref 78.0–100.0)
Platelets: 277 K/uL (ref 150.0–400.0)
RBC: 4.74 Mil/uL (ref 3.87–5.11)
RDW: 12.7 % (ref 11.5–15.5)
WBC: 4.9 K/uL (ref 4.0–10.5)

## 2024-03-06 LAB — VITAMIN D 25 HYDROXY (VIT D DEFICIENCY, FRACTURES): VITD: 65.85 ng/mL (ref 30.00–100.00)

## 2024-03-06 LAB — LIPID PANEL
Cholesterol: 196 mg/dL (ref 28–200)
HDL: 84.4 mg/dL
LDL Cholesterol: 100 mg/dL — ABNORMAL HIGH (ref 10–99)
NonHDL: 111.12
Total CHOL/HDL Ratio: 2
Triglycerides: 57 mg/dL (ref 10.0–149.0)
VLDL: 11.4 mg/dL (ref 0.0–40.0)

## 2024-03-06 LAB — HEMOGLOBIN A1C: Hgb A1c MFr Bld: 5.6 % (ref 4.6–6.5)

## 2024-03-06 LAB — TSH: TSH: 1.43 u[IU]/mL (ref 0.35–5.50)

## 2024-03-06 MED ORDER — PENCICLOVIR 1 % EX CREA: 1.0000 | TOPICAL_CREAM | CUTANEOUS | 8 refills | Status: AC | PRN

## 2024-03-06 NOTE — Assessment & Plan Note (Addendum)
 Chronic dexa up to date via gyn Will get DEXA report Was on fosamax  - stopped Continue calcium and vitamin D  supplementation Check vitamin D  level Stressed regular exercise

## 2024-03-06 NOTE — Assessment & Plan Note (Addendum)
 Chronic Lab Results  Component Value Date   LDLCALC 116 (H) 08/18/2022   History of CVA LDL not at goal Regular exercise and healthy diet encouraged Check lipid panel, cmp Continue pravastatin  20 mg daily Depending on LDL and CT CAC will adjust pravastatin  dose or switch to a stronger statin

## 2024-03-06 NOTE — Assessment & Plan Note (Signed)
 New BMI 26.46 Over the past several months she has gained some weight and is having difficult time losing she is frustrated because this has not been an issue for her in the past She denies not eating that much and feels she is eating fairly healthy She is not exercising regularly She is interested in trying a medication to help her-like GLP-1 agonist Discussed pros and cons of the medication and that that is not a long-term fix-The only thing that works long-term is diet, exercise I would like her to try to do this naturally first since she is overweight and not obese-stressed the need for regular exercise at least 5 days a week Consider calorie counting just to make sure she is not consuming too many calories Not taking metformin  regularly and I would advise going back on this which may help I am concerned about possible muscle manage she still has osteoporosis that could have an impact on that

## 2024-03-06 NOTE — Assessment & Plan Note (Addendum)
 Chronic BP well controlled hide been on the low side Continue cardizem  90 mg bid Currently taking lisinopril  20 mg twice daily-discussed that since her blood pressure is still well-controlled she could try decreasing this to once a day to see if her blood pressure is still controlled and if it is continue once daily, but continue to monitor regularly Cmp, cbc EKG today

## 2024-03-06 NOTE — Assessment & Plan Note (Addendum)
 H/o CVA LDL not at goal-discussed possibly increasing pravastatin  or switching to a stronger medication Will get CT CAC and lipid panel today and then make a decision Residual symptoms - Mild numbness in hand when tired Taking ASA 81 mg daily BP controlled Continue pravastatin  20 mg daily

## 2024-03-06 NOTE — Assessment & Plan Note (Addendum)
 Chronic Lab Results  Component Value Date   HGBA1C 5.6 08/18/2022    Check a1c Low sugar / carb diet Stressed regular exercise Currently not taking metformin  500 mg twice daily Discussed restarting metformin  500 mg twice daily to see if it aids in weight loss and insulin  resistance

## 2024-03-07 ENCOUNTER — Ambulatory Visit: Payer: Self-pay | Admitting: Internal Medicine

## 2024-03-21 ENCOUNTER — Other Ambulatory Visit (HOSPITAL_BASED_OUTPATIENT_CLINIC_OR_DEPARTMENT_OTHER)

## 2024-03-28 ENCOUNTER — Ambulatory Visit (HOSPITAL_BASED_OUTPATIENT_CLINIC_OR_DEPARTMENT_OTHER)
Admission: RE | Admit: 2024-03-28 | Discharge: 2024-03-28 | Disposition: A | Payer: Self-pay | Source: Ambulatory Visit | Attending: Internal Medicine

## 2024-03-28 DIAGNOSIS — Z136 Encounter for screening for cardiovascular disorders: Secondary | ICD-10-CM

## 2024-03-29 ENCOUNTER — Other Ambulatory Visit (HOSPITAL_BASED_OUTPATIENT_CLINIC_OR_DEPARTMENT_OTHER)

## 2024-05-08 ENCOUNTER — Encounter: Admitting: Internal Medicine

## 2025-03-10 ENCOUNTER — Encounter: Admitting: Internal Medicine
